# Patient Record
Sex: Male | Born: 1935 | Race: White | Hispanic: No | Marital: Married
Health system: Southern US, Community
[De-identification: ages and names within clinical notes are randomized; demographics above are authoritative.]

---

## 2009-09-17 ENCOUNTER — Ambulatory Visit: Payer: Self-pay | Admitting: Family Medicine

## 2009-09-21 ENCOUNTER — Ambulatory Visit: Payer: Self-pay | Admitting: Family Medicine

## 2012-06-03 ENCOUNTER — Ambulatory Visit: Payer: Self-pay | Admitting: Specialist

## 2012-06-07 ENCOUNTER — Ambulatory Visit: Payer: Self-pay | Admitting: Internal Medicine

## 2012-06-14 ENCOUNTER — Ambulatory Visit: Payer: Self-pay | Admitting: Internal Medicine

## 2012-06-14 ENCOUNTER — Inpatient Hospital Stay: Payer: Self-pay | Admitting: Internal Medicine

## 2012-06-14 LAB — CBC WITH DIFFERENTIAL/PLATELET
Basophil #: 0.1 10*3/uL (ref 0.0–0.1)
Basophil %: 0.7 %
Eosinophil #: 0 10*3/uL (ref 0.0–0.7)
Eosinophil %: 0.6 %
HCT: 36.5 % — ABNORMAL LOW (ref 40.0–52.0)
Lymphocyte #: 0.6 10*3/uL — ABNORMAL LOW (ref 1.0–3.6)
Lymphocyte %: 9 %
MCH: 28.4 pg (ref 26.0–34.0)
MCHC: 32 g/dL (ref 32.0–36.0)
MCV: 89 fL (ref 80–100)
Monocyte #: 0.5 x10 3/mm (ref 0.2–1.0)
Monocyte %: 6.7 %
Neutrophil #: 5.8 10*3/uL (ref 1.4–6.5)
Neutrophil %: 83 %
RBC: 4.12 10*6/uL — ABNORMAL LOW (ref 4.40–5.90)
RDW: 15.2 % — ABNORMAL HIGH (ref 11.5–14.5)

## 2012-06-14 LAB — COMPREHENSIVE METABOLIC PANEL
Albumin: 2.7 g/dL — ABNORMAL LOW (ref 3.4–5.0)
Anion Gap: 7 (ref 7–16)
Calcium, Total: 8.8 mg/dL (ref 8.5–10.1)
Chloride: 106 mmol/L (ref 98–107)
Creatinine: 1.04 mg/dL (ref 0.60–1.30)
EGFR (Non-African Amer.): >60
Osmolality: 288 (ref 275–301)
Potassium: 4.4 mmol/L (ref 3.5–5.1)
SGOT(AST): 34 U/L (ref 15–37)
Total Protein: 7 g/dL (ref 6.4–8.2)

## 2012-06-14 LAB — MAGNESIUM: Magnesium: 1.8 mg/dL

## 2012-06-15 LAB — CREATININE, SERUM
Creatinine: 0.94 mg/dL (ref 0.60–1.30)
EGFR (African American): >60
EGFR (Non-African Amer.): >60

## 2012-06-15 LAB — PROTIME-INR: Prothrombin Time: 14.8 secs — ABNORMAL HIGH (ref 11.5–14.7)

## 2012-06-16 LAB — BASIC METABOLIC PANEL
Anion Gap: 6 — ABNORMAL LOW (ref 7–16)
Calcium, Total: 8.9 mg/dL (ref 8.5–10.1)
Chloride: 104 mmol/L (ref 98–107)
Co2: 27 mmol/L (ref 21–32)
Creatinine: 0.91 mg/dL (ref 0.60–1.30)
EGFR (African American): >60
EGFR (Non-African Amer.): >60
Glucose: 183 mg/dL — ABNORMAL HIGH (ref 65–99)
Potassium: 5.1 mmol/L (ref 3.5–5.1)

## 2012-06-17 LAB — CBC WITH DIFFERENTIAL/PLATELET
Basophil #: 0 10*3/uL (ref 0.0–0.1)
Basophil %: 0.8 %
Eosinophil #: 0 10*3/uL (ref 0.0–0.7)
Eosinophil %: 0 %
HCT: 38.3 % — ABNORMAL LOW (ref 40.0–52.0)
HGB: 11.9 g/dL — ABNORMAL LOW (ref 13.0–18.0)
HGB: 11.9 g/dL — ABNORMAL LOW (ref 13.0–18.0)
Lymphocyte #: 0.2 10*3/uL — ABNORMAL LOW (ref 1.0–3.6)
Lymphocyte %: 1 %
MCH: 28.1 pg (ref 26.0–34.0)
MCHC: 31.4 g/dL — ABNORMAL LOW (ref 32.0–36.0)
MCHC: 31 g/dL — ABNORMAL LOW (ref 32.0–36.0)
MCV: 90 fL (ref 80–100)
Monocyte #: 0.4 x10 3/mm (ref 0.2–1.0)
Monocyte %: 3.2 %
Neutrophil %: 94.4 %
Neutrophil %: 95 %
Platelet: 239 10*3/uL (ref 150–440)
RBC: 4.25 10*6/uL — ABNORMAL LOW (ref 4.40–5.90)
RBC: 4.27 10*6/uL — ABNORMAL LOW (ref 4.40–5.90)
RDW: 15.5 % — ABNORMAL HIGH (ref 11.5–14.5)
RDW: 15.3 % — ABNORMAL HIGH (ref 11.5–14.5)
WBC: 14.7 10*3/uL — ABNORMAL HIGH (ref 3.8–10.6)
WBC: 11.4 10*3/uL — ABNORMAL HIGH (ref 3.8–10.6)

## 2012-06-17 LAB — BASIC METABOLIC PANEL
Anion Gap: 4 — ABNORMAL LOW (ref 7–16)
BUN: 45 mg/dL — ABNORMAL HIGH (ref 7–18)
BUN: 45 mg/dL — ABNORMAL HIGH (ref 7–18)
Calcium, Total: 8.8 mg/dL (ref 8.5–10.1)
Calcium, Total: 8.9 mg/dL (ref 8.5–10.1)
Chloride: 101 mmol/L (ref 98–107)
Co2: 34 mmol/L — ABNORMAL HIGH (ref 21–32)
Creatinine: 0.99 mg/dL (ref 0.60–1.30)
Creatinine: 1.03 mg/dL (ref 0.60–1.30)
EGFR (Non-African Amer.): >60
Glucose: 135 mg/dL — ABNORMAL HIGH (ref 65–99)
Glucose: 129 mg/dL — ABNORMAL HIGH (ref 65–99)
Osmolality: 291 (ref 275–301)
Osmolality: 289 (ref 275–301)
Potassium: 5.9 mmol/L — ABNORMAL HIGH (ref 3.5–5.1)
Sodium: 139 mmol/L (ref 136–145)

## 2012-06-17 LAB — URIC ACID: Uric Acid: 6.9 mg/dL (ref 3.5–7.2)

## 2012-06-17 LAB — COMPREHENSIVE METABOLIC PANEL
Albumin: 2.7 g/dL — ABNORMAL LOW (ref 3.4–5.0)
Alkaline Phosphatase: 140 U/L — ABNORMAL HIGH (ref 50–136)
Anion Gap: 6 — ABNORMAL LOW (ref 7–16)
Calcium, Total: 9 mg/dL (ref 8.5–10.1)
Co2: 29 mmol/L (ref 21–32)
Creatinine: 0.93 mg/dL (ref 0.60–1.30)
EGFR (Non-African Amer.): >60
Osmolality: 282 (ref 275–301)
Potassium: 6.3 mmol/L — ABNORMAL HIGH (ref 3.5–5.1)
SGOT(AST): 38 U/L — ABNORMAL HIGH (ref 15–37)
SGPT (ALT): 36 U/L (ref 12–78)
Total Protein: 6.9 g/dL (ref 6.4–8.2)

## 2012-06-17 LAB — PHOSPHORUS: Phosphorus: 2.6 mg/dL (ref 2.5–4.9)

## 2012-06-17 LAB — POTASSIUM
Potassium: 5.8 mmol/L — ABNORMAL HIGH (ref 3.5–5.1)
Potassium: 5.9 mmol/L — ABNORMAL HIGH (ref 3.5–5.1)

## 2012-06-17 LAB — CREATININE, SERUM
Creatinine: 0.99 mg/dL (ref 0.60–1.30)
EGFR (African American): >60
EGFR (Non-African Amer.): >60

## 2012-06-18 LAB — COMPREHENSIVE METABOLIC PANEL
Albumin: 2.6 g/dL — ABNORMAL LOW (ref 3.4–5.0)
Bilirubin,Total: 0.2 mg/dL (ref 0.2–1.0)
Calcium, Total: 8.4 mg/dL — ABNORMAL LOW (ref 8.5–10.1)
Co2: 32 mmol/L (ref 21–32)
Osmolality: 296 (ref 275–301)
Potassium: 4.8 mmol/L (ref 3.5–5.1)
SGOT(AST): 52 U/L — ABNORMAL HIGH (ref 15–37)
Total Protein: 6.4 g/dL (ref 6.4–8.2)

## 2012-06-18 LAB — BASIC METABOLIC PANEL
Anion Gap: 8 (ref 7–16)
BUN: 44 mg/dL — ABNORMAL HIGH (ref 7–18)
Calcium, Total: 8.3 mg/dL — ABNORMAL LOW (ref 8.5–10.1)
Co2: 29 mmol/L (ref 21–32)
EGFR (African American): >60
EGFR (Non-African Amer.): >60
Glucose: 117 mg/dL — ABNORMAL HIGH (ref 65–99)
Osmolality: 288 (ref 275–301)
Potassium: 5.2 mmol/L — ABNORMAL HIGH (ref 3.5–5.1)
Sodium: 138 mmol/L (ref 136–145)

## 2012-06-18 LAB — PHOSPHORUS: Phosphorus: 3.6 mg/dL (ref 2.5–4.9)

## 2012-06-18 LAB — CREATININE, SERUM
Creatinine: 1 mg/dL (ref 0.60–1.30)
EGFR (Non-African Amer.): >60

## 2012-06-18 LAB — POTASSIUM: Potassium: 5.7 mmol/L — ABNORMAL HIGH (ref 3.5–5.1)

## 2012-06-19 LAB — POTASSIUM: Potassium: 4.8 mmol/L (ref 3.5–5.1)

## 2012-06-19 LAB — BASIC METABOLIC PANEL
Anion Gap: 8 (ref 7–16)
Creatinine: 0.92 mg/dL (ref 0.60–1.30)
EGFR (African American): >60
EGFR (Non-African Amer.): >60
Sodium: 143 mmol/L (ref 136–145)

## 2012-06-19 LAB — PROTIME-INR: INR: 1

## 2012-06-19 LAB — PLATELET COUNT: Platelet: 196 10*3/uL (ref 150–440)

## 2012-06-19 LAB — SGOT (AST)(ARMC): SGOT(AST): 43 U/L — ABNORMAL HIGH (ref 15–37)

## 2012-06-20 LAB — BASIC METABOLIC PANEL
BUN: 36 mg/dL — ABNORMAL HIGH (ref 7–18)
Calcium, Total: 8.5 mg/dL (ref 8.5–10.1)
Creatinine: 0.65 mg/dL (ref 0.60–1.30)
EGFR (African American): >60
Glucose: 105 mg/dL — ABNORMAL HIGH (ref 65–99)
Sodium: 143 mmol/L (ref 136–145)

## 2012-06-20 LAB — CBC WITH DIFFERENTIAL/PLATELET
Eosinophil %: 0 %
HCT: 38 % — ABNORMAL LOW (ref 40.0–52.0)
HGB: 12.1 g/dL — ABNORMAL LOW (ref 13.0–18.0)
Lymphocyte %: 0.5 %
MCHC: 31.7 g/dL — ABNORMAL LOW (ref 32.0–36.0)
MCV: 90 fL (ref 80–100)
Monocyte #: 0.1 x10 3/mm — ABNORMAL LOW (ref 0.2–1.0)
Monocyte %: 0.3 %
Neutrophil #: 39.5 10*3/uL — ABNORMAL HIGH (ref 1.4–6.5)
Neutrophil %: 98.6 %
RDW: 15.7 % — ABNORMAL HIGH (ref 11.5–14.5)
WBC: 40.1 10*3/uL — ABNORMAL HIGH (ref 3.8–10.6)

## 2012-06-21 LAB — BASIC METABOLIC PANEL
BUN: 34 mg/dL — ABNORMAL HIGH (ref 7–18)
Calcium, Total: 8.6 mg/dL (ref 8.5–10.1)
Chloride: 107 mmol/L (ref 98–107)
Co2: 32 mmol/L (ref 21–32)
Creatinine: 0.87 mg/dL (ref 0.60–1.30)
Glucose: 109 mg/dL — ABNORMAL HIGH (ref 65–99)
Osmolality: 295 (ref 275–301)
Potassium: 4.9 mmol/L (ref 3.5–5.1)
Sodium: 144 mmol/L (ref 136–145)

## 2012-06-22 ENCOUNTER — Ambulatory Visit: Payer: Self-pay | Admitting: Internal Medicine

## 2012-06-22 LAB — CBC WITH DIFFERENTIAL/PLATELET
Basophil #: 0 10*3/uL (ref 0.0–0.1)
Basophil %: 0.1 %
Eosinophil #: 0 10*3/uL (ref 0.0–0.7)
Eosinophil %: 0 %
HCT: 35.6 % — ABNORMAL LOW (ref 40.0–52.0)
HGB: 10.9 g/dL — ABNORMAL LOW (ref 13.0–18.0)
Lymphocyte %: 1 %
MCHC: 30.7 g/dL — ABNORMAL LOW (ref 32.0–36.0)
Monocyte %: 1.2 %
Neutrophil #: 31.1 10*3/uL — ABNORMAL HIGH (ref 1.4–6.5)
Neutrophil %: 97.7 %
Platelet: 93 10*3/uL — ABNORMAL LOW (ref 150–440)
WBC: 31.9 10*3/uL — ABNORMAL HIGH (ref 3.8–10.6)

## 2012-06-22 LAB — BASIC METABOLIC PANEL
Anion Gap: 6 — ABNORMAL LOW (ref 7–16)
Co2: 34 mmol/L — ABNORMAL HIGH (ref 21–32)

## 2012-06-23 LAB — CBC WITH DIFFERENTIAL/PLATELET
Basophil #: 0 10*3/uL (ref 0.0–0.1)
Basophil %: 0.1 %
Eosinophil #: 0 10*3/uL (ref 0.0–0.7)
Lymphocyte %: 1.7 %
MCHC: 30.9 g/dL — ABNORMAL LOW (ref 32.0–36.0)
MCV: 90 fL (ref 80–100)
Monocyte #: 0.7 x10 3/mm (ref 0.2–1.0)
Monocyte %: 3.6 %
Neutrophil %: 94.6 %
Platelet: 54 10*3/uL — ABNORMAL LOW (ref 150–440)
RBC: 3.87 10*6/uL — ABNORMAL LOW (ref 4.40–5.90)
RDW: 15.6 % — ABNORMAL HIGH (ref 11.5–14.5)
WBC: 19.6 10*3/uL — ABNORMAL HIGH (ref 3.8–10.6)

## 2012-06-23 LAB — BASIC METABOLIC PANEL
Anion Gap: 4 — ABNORMAL LOW (ref 7–16)
BUN: 25 mg/dL — ABNORMAL HIGH (ref 7–18)
Co2: 33 mmol/L — ABNORMAL HIGH (ref 21–32)
Creatinine: 0.82 mg/dL (ref 0.60–1.30)
Osmolality: 284 (ref 275–301)
Potassium: 5.9 mmol/L — ABNORMAL HIGH (ref 3.5–5.1)
Sodium: 140 mmol/L (ref 136–145)

## 2012-06-23 LAB — POTASSIUM: Potassium: 5.4 mmol/L — ABNORMAL HIGH (ref 3.5–5.1)

## 2012-06-24 LAB — BASIC METABOLIC PANEL
Anion Gap: 4 — ABNORMAL LOW (ref 7–16)
Calcium, Total: 8.2 mg/dL — ABNORMAL LOW (ref 8.5–10.1)
Co2: 35 mmol/L — ABNORMAL HIGH (ref 21–32)
EGFR (African American): >60
EGFR (Non-African Amer.): >60
Potassium: 4.3 mmol/L (ref 3.5–5.1)

## 2012-06-24 LAB — CBC WITH DIFFERENTIAL/PLATELET
Bands: 17 %
MCV: 90 fL (ref 80–100)
Monocytes: 3 %
Myelocyte: 1 %
Platelet: 30 10*3/uL — CL (ref 150–440)
RDW: 15.2 % — ABNORMAL HIGH (ref 11.5–14.5)

## 2012-06-25 LAB — CBC WITH DIFFERENTIAL/PLATELET
Basophil #: 0 10*3/uL (ref 0.0–0.1)
Eosinophil #: 0.1 10*3/uL (ref 0.0–0.7)
HCT: 33.3 % — ABNORMAL LOW (ref 40.0–52.0)
Lymphocyte #: 0.7 10*3/uL — ABNORMAL LOW (ref 1.0–3.6)
Lymphocyte %: 10.6 %
MCH: 28 pg (ref 26.0–34.0)
Monocyte %: 7.7 %
Neutrophil #: 5 10*3/uL (ref 1.4–6.5)
Neutrophil %: 80.7 %
Platelet: 21 10*3/uL — CL (ref 150–440)
RBC: 3.76 10*6/uL — ABNORMAL LOW (ref 4.40–5.90)
RDW: 15.5 % — ABNORMAL HIGH (ref 11.5–14.5)
WBC: 6.2 10*3/uL (ref 3.8–10.6)

## 2012-06-25 LAB — PLATELET COUNT: Platelet: 20 10*3/uL — CL (ref 150–440)

## 2012-06-25 LAB — BASIC METABOLIC PANEL
BUN: 20 mg/dL — ABNORMAL HIGH (ref 7–18)
Calcium, Total: 8.5 mg/dL (ref 8.5–10.1)
Co2: 35 mmol/L — ABNORMAL HIGH (ref 21–32)
Creatinine: 0.8 mg/dL (ref 0.60–1.30)
EGFR (African American): >60
EGFR (Non-African Amer.): >60
Glucose: 77 mg/dL (ref 65–99)
Sodium: 140 mmol/L (ref 136–145)

## 2012-06-25 LAB — POTASSIUM: Potassium: 4.6 mmol/L (ref 3.5–5.1)

## 2012-06-26 LAB — CBC WITH DIFFERENTIAL/PLATELET
Basophil #: 0 10*3/uL (ref 0.0–0.1)
Eosinophil #: 0 10*3/uL (ref 0.0–0.7)
Eosinophil %: 0.5 %
HGB: 10.6 g/dL — ABNORMAL LOW (ref 13.0–18.0)
Lymphocyte #: 0.9 10*3/uL — ABNORMAL LOW (ref 1.0–3.6)
Lymphocyte %: 9.5 %
Monocyte #: 0.4 x10 3/mm (ref 0.2–1.0)
Monocyte %: 4.4 %
Neutrophil #: 8.1 10*3/uL — ABNORMAL HIGH (ref 1.4–6.5)
Platelet: 23 10*3/uL — CL (ref 150–440)
RBC: 3.79 10*6/uL — ABNORMAL LOW (ref 4.40–5.90)
RDW: 15.7 % — ABNORMAL HIGH (ref 11.5–14.5)
WBC: 9.5 10*3/uL (ref 3.8–10.6)

## 2012-06-26 LAB — CREATININE, SERUM: EGFR (Non-African Amer.): >60

## 2012-06-27 LAB — COMPREHENSIVE METABOLIC PANEL
Alkaline Phosphatase: 150 U/L — ABNORMAL HIGH (ref 50–136)
Anion Gap: 2 — ABNORMAL LOW (ref 7–16)
BUN: 20 mg/dL — ABNORMAL HIGH (ref 7–18)
Calcium, Total: 9.4 mg/dL (ref 8.5–10.1)
Chloride: 98 mmol/L (ref 98–107)
Co2: 39 mmol/L — ABNORMAL HIGH (ref 21–32)
Creatinine: 0.84 mg/dL (ref 0.60–1.30)
EGFR (Non-African Amer.): >60
Glucose: 82 mg/dL (ref 65–99)
Osmolality: 279 (ref 275–301)
SGPT (ALT): 48 U/L (ref 12–78)
Total Protein: 5.8 g/dL — ABNORMAL LOW (ref 6.4–8.2)

## 2012-06-27 LAB — CBC WITH DIFFERENTIAL/PLATELET
Basophil %: 0.3 %
Eosinophil #: 0 10*3/uL (ref 0.0–0.7)
Eosinophil %: 0.3 %
HCT: 34 % — ABNORMAL LOW (ref 40.0–52.0)
HGB: 10.8 g/dL — ABNORMAL LOW (ref 13.0–18.0)
Lymphocyte %: 7 %
MCHC: 31.8 g/dL — ABNORMAL LOW (ref 32.0–36.0)
Monocyte #: 0.6 x10 3/mm (ref 0.2–1.0)
Monocyte %: 4.2 %
Neutrophil #: 11.8 10*3/uL — ABNORMAL HIGH (ref 1.4–6.5)
Platelet: 31 10*3/uL — ABNORMAL LOW (ref 150–440)
RDW: 15.7 % — ABNORMAL HIGH (ref 11.5–14.5)

## 2012-06-28 LAB — COMPREHENSIVE METABOLIC PANEL
Albumin: 2.7 g/dL — ABNORMAL LOW (ref 3.4–5.0)
Alkaline Phosphatase: 171 U/L — ABNORMAL HIGH (ref 50–136)
Anion Gap: 3 — ABNORMAL LOW (ref 7–16)
Bilirubin,Total: 0.4 mg/dL (ref 0.2–1.0)
Chloride: 100 mmol/L (ref 98–107)
Co2: 36 mmol/L — ABNORMAL HIGH (ref 21–32)
Creatinine: 0.8 mg/dL (ref 0.60–1.30)
EGFR (African American): >60
Glucose: 88 mg/dL (ref 65–99)
Osmolality: 280 (ref 275–301)
SGOT(AST): 21 U/L (ref 15–37)
SGPT (ALT): 46 U/L (ref 12–78)
Sodium: 139 mmol/L (ref 136–145)
Total Protein: 5.9 g/dL — ABNORMAL LOW (ref 6.4–8.2)

## 2012-06-28 LAB — CBC WITH DIFFERENTIAL/PLATELET
Bands: 11 %
Basophil %: 0.3 %
Eosinophil #: 0 10*3/uL (ref 0.0–0.7)
HCT: 33.7 % — ABNORMAL LOW (ref 40.0–52.0)
HGB: 10.7 g/dL — ABNORMAL LOW (ref 13.0–18.0)
Lymphocyte #: 1.3 10*3/uL (ref 1.0–3.6)
Lymphocyte %: 8.2 %
Lymphocytes: 6 %
MCH: 27.8 pg (ref 26.0–34.0)
MCV: 88 fL (ref 80–100)
Monocyte #: 0.8 x10 3/mm (ref 0.2–1.0)
Neutrophil %: 86.6 %
Platelet: 52 10*3/uL — ABNORMAL LOW (ref 150–440)
RBC: 3.84 10*6/uL — ABNORMAL LOW (ref 4.40–5.90)
RDW: 15.3 % — ABNORMAL HIGH (ref 11.5–14.5)
WBC: 16.5 10*3/uL — ABNORMAL HIGH (ref 3.8–10.6)

## 2012-06-28 LAB — POTASSIUM: Potassium: 4.8 mmol/L (ref 3.5–5.1)

## 2012-06-29 ENCOUNTER — Other Ambulatory Visit: Payer: Self-pay | Admitting: Internal Medicine

## 2012-06-30 ENCOUNTER — Other Ambulatory Visit: Payer: Self-pay | Admitting: Internal Medicine

## 2012-07-10 ENCOUNTER — Ambulatory Visit: Payer: Self-pay | Admitting: Internal Medicine

## 2012-07-10 LAB — CBC CANCER CENTER
Basophil %: 1.4 %
Eosinophil #: 0 x10 3/mm (ref 0.0–0.7)
HCT: 31.9 % — ABNORMAL LOW (ref 40.0–52.0)
HGB: 10.2 g/dL — ABNORMAL LOW (ref 13.0–18.0)
Lymphocyte %: 11 %
MCH: 28.1 pg (ref 26.0–34.0)
MCHC: 32 g/dL (ref 32.0–36.0)
Monocyte #: 1 x10 3/mm (ref 0.2–1.0)
Neutrophil #: 8.4 x10 3/mm — ABNORMAL HIGH (ref 1.4–6.5)
Neutrophil %: 77.6 %
Platelet: 356 x10 3/mm (ref 150–440)
RBC: 3.62 10*6/uL — ABNORMAL LOW (ref 4.40–5.90)
RDW: 17.4 % — ABNORMAL HIGH (ref 11.5–14.5)
WBC: 10.8 x10 3/mm — ABNORMAL HIGH (ref 3.8–10.6)

## 2012-07-10 LAB — COMPREHENSIVE METABOLIC PANEL
Alkaline Phosphatase: 137 U/L — ABNORMAL HIGH (ref 50–136)
BUN: 16 mg/dL (ref 7–18)
Calcium, Total: 9.4 mg/dL (ref 8.5–10.1)
Chloride: 102 mmol/L (ref 98–107)
Co2: 35 mmol/L — ABNORMAL HIGH (ref 21–32)
Creatinine: 0.94 mg/dL (ref 0.60–1.30)
EGFR (African American): >60
Glucose: 93 mg/dL (ref 65–99)
Osmolality: 282 (ref 275–301)
SGOT(AST): 21 U/L (ref 15–37)
SGPT (ALT): 28 U/L (ref 12–78)
Sodium: 141 mmol/L (ref 136–145)
Total Protein: 6.7 g/dL (ref 6.4–8.2)

## 2012-07-15 LAB — CBC CANCER CENTER
Basophil #: 0.1 x10 3/mm (ref 0.0–0.1)
Basophil %: 1.2 %
Eosinophil #: 0 x10 3/mm (ref 0.0–0.7)
HCT: 31.7 % — ABNORMAL LOW (ref 40.0–52.0)
HGB: 10.3 g/dL — ABNORMAL LOW (ref 13.0–18.0)
Lymphocyte #: 1.4 x10 3/mm (ref 1.0–3.6)
MCHC: 32.5 g/dL (ref 32.0–36.0)
MCV: 89 fL (ref 80–100)
Monocyte %: 10.6 %
Neutrophil #: 7.3 x10 3/mm — ABNORMAL HIGH (ref 1.4–6.5)
Platelet: 376 x10 3/mm (ref 150–440)
RDW: 18.3 % — ABNORMAL HIGH (ref 11.5–14.5)

## 2012-07-15 LAB — CREATININE, SERUM: Creatinine: 1 mg/dL (ref 0.60–1.30)

## 2012-07-16 LAB — CREATININE, SERUM
Creatinine: 1.04 mg/dL (ref 0.60–1.30)
EGFR (Non-African Amer.): >60

## 2012-07-17 LAB — BASIC METABOLIC PANEL
Anion Gap: 9 (ref 7–16)
BUN: 28 mg/dL — ABNORMAL HIGH (ref 7–18)
Co2: 29 mmol/L (ref 21–32)
Creatinine: 1.18 mg/dL (ref 0.60–1.30)
Glucose: 135 mg/dL — ABNORMAL HIGH (ref 65–99)
Osmolality: 289 (ref 275–301)
Potassium: 5.2 mmol/L — ABNORMAL HIGH (ref 3.5–5.1)
Sodium: 141 mmol/L (ref 136–145)

## 2012-07-18 LAB — CREATININE, SERUM
Creatinine: 1.1 mg/dL (ref 0.60–1.30)
EGFR (African American): >60

## 2012-07-19 LAB — POTASSIUM: Potassium: 3.9 mmol/L (ref 3.5–5.1)

## 2012-07-20 ENCOUNTER — Ambulatory Visit: Payer: Self-pay | Admitting: Internal Medicine

## 2012-07-22 LAB — CBC CANCER CENTER
Basophil #: 0 x10 3/mm (ref 0.0–0.1)
Eosinophil #: 0.1 x10 3/mm (ref 0.0–0.7)
Eosinophil %: 0.6 %
HCT: 30.7 % — ABNORMAL LOW (ref 40.0–52.0)
HGB: 9.6 g/dL — ABNORMAL LOW (ref 13.0–18.0)
Lymphocyte #: 1 x10 3/mm (ref 1.0–3.6)
Lymphocyte %: 5.4 %
MCH: 27.8 pg (ref 26.0–34.0)
MCV: 89 fL (ref 80–100)
Platelet: 223 x10 3/mm (ref 150–440)
RDW: 18.6 % — ABNORMAL HIGH (ref 11.5–14.5)
WBC: 18.9 x10 3/mm — ABNORMAL HIGH (ref 3.8–10.6)

## 2012-07-25 LAB — POTASSIUM: Potassium: 4.3 mmol/L (ref 3.5–5.1)

## 2012-07-25 LAB — CBC CANCER CENTER
Basophil #: 0.2 x10 3/mm — ABNORMAL HIGH (ref 0.0–0.1)
Basophil %: 2.8 %
Eosinophil #: 0.1 x10 3/mm (ref 0.0–0.7)
HGB: 9.8 g/dL — ABNORMAL LOW (ref 13.0–18.0)
Lymphocyte #: 0.9 x10 3/mm — ABNORMAL LOW (ref 1.0–3.6)
MCHC: 32.2 g/dL (ref 32.0–36.0)
MCV: 90 fL (ref 80–100)
Monocyte #: 0.3 x10 3/mm (ref 0.2–1.0)
Monocyte %: 4.8 %
Neutrophil #: 3.9 x10 3/mm (ref 1.4–6.5)
Neutrophil %: 73.9 %
Platelet: 154 x10 3/mm (ref 150–440)
RBC: 3.39 10*6/uL — ABNORMAL LOW (ref 4.40–5.90)
RDW: 18.3 % — ABNORMAL HIGH (ref 11.5–14.5)
WBC: 5.3 x10 3/mm (ref 3.8–10.6)

## 2012-07-29 LAB — CBC CANCER CENTER
Basophil #: 0.1 x10 3/mm (ref 0.0–0.1)
Basophil %: 0.8 %
Eosinophil %: 0.6 %
HCT: 31.3 % — ABNORMAL LOW (ref 40.0–52.0)
HGB: 10 g/dL — ABNORMAL LOW (ref 13.0–18.0)
Lymphocyte %: 18.9 %
MCV: 91 fL (ref 80–100)
Platelet: 106 x10 3/mm — ABNORMAL LOW (ref 150–440)
RBC: 3.46 10*6/uL — ABNORMAL LOW (ref 4.40–5.90)
RDW: 19.6 % — ABNORMAL HIGH (ref 11.5–14.5)

## 2012-07-29 LAB — POTASSIUM: Potassium: 4.5 mmol/L (ref 3.5–5.1)

## 2012-08-05 LAB — CBC CANCER CENTER
Basophil #: 0.1 x10 3/mm (ref 0.0–0.1)
Basophil %: 0.3 %
Eosinophil #: 0 x10 3/mm (ref 0.0–0.7)
Eosinophil %: 0.2 %
HCT: 31.1 % — ABNORMAL LOW (ref 40.0–52.0)
HGB: 9.7 g/dL — ABNORMAL LOW (ref 13.0–18.0)
Lymphocyte #: 1.5 x10 3/mm (ref 1.0–3.6)
MCHC: 31.1 g/dL — ABNORMAL LOW (ref 32.0–36.0)
Monocyte #: 1.4 x10 3/mm — ABNORMAL HIGH (ref 0.2–1.0)
Monocyte %: 6.8 %
Neutrophil #: 17.7 x10 3/mm — ABNORMAL HIGH (ref 1.4–6.5)
Neutrophil %: 85.7 %
Platelet: 403 x10 3/mm (ref 150–440)
RBC: 3.42 10*6/uL — ABNORMAL LOW (ref 4.40–5.90)
RDW: 20.6 % — ABNORMAL HIGH (ref 11.5–14.5)

## 2012-08-05 LAB — COMPREHENSIVE METABOLIC PANEL
Alkaline Phosphatase: 144 U/L — ABNORMAL HIGH (ref 50–136)
Anion Gap: 11 (ref 7–16)
BUN: 29 mg/dL — ABNORMAL HIGH (ref 7–18)
Bilirubin,Total: 0.3 mg/dL (ref 0.2–1.0)
Chloride: 103 mmol/L (ref 98–107)
Co2: 29 mmol/L (ref 21–32)
Creatinine: 1.32 mg/dL — ABNORMAL HIGH (ref 0.60–1.30)
EGFR (African American): 59 — ABNORMAL LOW
EGFR (Non-African Amer.): 51 — ABNORMAL LOW
Total Protein: 7.5 g/dL (ref 6.4–8.2)

## 2012-08-12 LAB — CBC CANCER CENTER
Basophil #: 0.1 x10 3/mm (ref 0.0–0.1)
Eosinophil #: 0.1 x10 3/mm (ref 0.0–0.7)
HCT: 30.4 % — ABNORMAL LOW (ref 40.0–52.0)
HGB: 9.6 g/dL — ABNORMAL LOW (ref 13.0–18.0)
Lymphocyte #: 1.8 x10 3/mm (ref 1.0–3.6)
Lymphocyte %: 9.4 %
MCH: 28.6 pg (ref 26.0–34.0)
MCHC: 31.7 g/dL — ABNORMAL LOW (ref 32.0–36.0)
Monocyte #: 1.5 x10 3/mm — ABNORMAL HIGH (ref 0.2–1.0)
Neutrophil %: 81.8 %
RBC: 3.37 10*6/uL — ABNORMAL LOW (ref 4.40–5.90)
WBC: 19 x10 3/mm — ABNORMAL HIGH (ref 3.8–10.6)

## 2012-08-12 LAB — CREATININE, SERUM
Creatinine: 1.23 mg/dL (ref 0.60–1.30)
EGFR (African American): >60
EGFR (Non-African Amer.): 55 — ABNORMAL LOW

## 2012-08-14 LAB — BASIC METABOLIC PANEL
Anion Gap: 9 (ref 7–16)
BUN: 39 mg/dL — ABNORMAL HIGH (ref 7–18)
Chloride: 101 mmol/L (ref 98–107)
Creatinine: 1.56 mg/dL — ABNORMAL HIGH (ref 0.60–1.30)
EGFR (African American): 48 — ABNORMAL LOW
EGFR (Non-African Amer.): 42 — ABNORMAL LOW
Glucose: 165 mg/dL — ABNORMAL HIGH (ref 65–99)
Osmolality: 294 (ref 275–301)
Potassium: 4.8 mmol/L (ref 3.5–5.1)
Sodium: 141 mmol/L (ref 136–145)

## 2012-08-15 LAB — POTASSIUM: Potassium: 5.2 mmol/L — ABNORMAL HIGH (ref 3.5–5.1)

## 2012-08-16 LAB — BASIC METABOLIC PANEL
Anion Gap: 5 — ABNORMAL LOW (ref 7–16)
BUN: 41 mg/dL — ABNORMAL HIGH (ref 7–18)
Creatinine: 1.25 mg/dL (ref 0.60–1.30)
EGFR (Non-African Amer.): 54 — ABNORMAL LOW
Potassium: 4.1 mmol/L (ref 3.5–5.1)
Sodium: 139 mmol/L (ref 136–145)

## 2012-08-20 ENCOUNTER — Ambulatory Visit: Payer: Self-pay | Admitting: Internal Medicine

## 2012-09-02 LAB — COMPREHENSIVE METABOLIC PANEL
Alkaline Phosphatase: 146 U/L — ABNORMAL HIGH (ref 50–136)
Anion Gap: 13 (ref 7–16)
Bilirubin,Total: 0.2 mg/dL (ref 0.2–1.0)
Calcium, Total: 9.6 mg/dL (ref 8.5–10.1)
Chloride: 102 mmol/L (ref 98–107)
Creatinine: 1.49 mg/dL — ABNORMAL HIGH (ref 0.60–1.30)
EGFR (Non-African Amer.): 44 — ABNORMAL LOW
Glucose: 190 mg/dL — ABNORMAL HIGH (ref 65–99)
Osmolality: 300 (ref 275–301)
SGPT (ALT): 20 U/L (ref 12–78)

## 2012-09-02 LAB — CBC CANCER CENTER
Basophil #: 0.1 x10 3/mm (ref 0.0–0.1)
Eosinophil #: 0 x10 3/mm (ref 0.0–0.7)
Eosinophil %: 0.3 %
HCT: 32.3 % — ABNORMAL LOW (ref 40.0–52.0)
HGB: 9.8 g/dL — ABNORMAL LOW (ref 13.0–18.0)
Lymphocyte #: 1.5 x10 3/mm (ref 1.0–3.6)
MCH: 28.5 pg (ref 26.0–34.0)
MCHC: 30.4 g/dL — ABNORMAL LOW (ref 32.0–36.0)
MCV: 94 fL (ref 80–100)
Monocyte #: 0.7 x10 3/mm (ref 0.2–1.0)
Neutrophil #: 12.6 x10 3/mm — ABNORMAL HIGH (ref 1.4–6.5)
Platelet: 295 x10 3/mm (ref 150–440)
RBC: 3.45 10*6/uL — ABNORMAL LOW (ref 4.40–5.90)
WBC: 14.9 x10 3/mm — ABNORMAL HIGH (ref 3.8–10.6)

## 2012-09-02 LAB — URIC ACID: Uric Acid: 6 mg/dL (ref 3.5–7.2)

## 2012-09-09 LAB — CBC CANCER CENTER
Basophil %: 1.2 %
Eosinophil #: 0.1 x10 3/mm (ref 0.0–0.7)
HCT: 32.1 % — ABNORMAL LOW (ref 40.0–52.0)
HGB: 10.2 g/dL — ABNORMAL LOW (ref 13.0–18.0)
Lymphocyte #: 0.9 x10 3/mm — ABNORMAL LOW (ref 1.0–3.6)
Lymphocyte %: 6.8 %
MCH: 29.9 pg (ref 26.0–34.0)
Monocyte #: 0.6 x10 3/mm (ref 0.2–1.0)
Neutrophil #: 11.3 x10 3/mm — ABNORMAL HIGH (ref 1.4–6.5)
Neutrophil %: 86.8 %
Platelet: 301 x10 3/mm (ref 150–440)

## 2012-09-09 LAB — COMPREHENSIVE METABOLIC PANEL
Alkaline Phosphatase: 122 U/L (ref 50–136)
Anion Gap: 4 — ABNORMAL LOW (ref 7–16)
Bilirubin,Total: 0.2 mg/dL (ref 0.2–1.0)
Calcium, Total: 9.2 mg/dL (ref 8.5–10.1)
Creatinine: 1 mg/dL (ref 0.60–1.30)
EGFR (African American): >60
Glucose: 131 mg/dL — ABNORMAL HIGH (ref 65–99)
Osmolality: 285 (ref 275–301)
Potassium: 3.8 mmol/L (ref 3.5–5.1)
SGOT(AST): 23 U/L (ref 15–37)
SGPT (ALT): 18 U/L (ref 12–78)
Sodium: 140 mmol/L (ref 136–145)
Total Protein: 6.6 g/dL (ref 6.4–8.2)

## 2012-09-11 LAB — BASIC METABOLIC PANEL
Anion Gap: 12 (ref 7–16)
BUN: 40 mg/dL — ABNORMAL HIGH (ref 7–18)
Calcium, Total: 9.6 mg/dL (ref 8.5–10.1)
Chloride: 105 mmol/L (ref 98–107)
Co2: 26 mmol/L (ref 21–32)
Creatinine: 1.34 mg/dL — ABNORMAL HIGH (ref 0.60–1.30)
Potassium: 4.6 mmol/L (ref 3.5–5.1)
Sodium: 143 mmol/L (ref 136–145)

## 2012-09-12 LAB — CREATININE, SERUM: EGFR (African American): >60

## 2012-09-16 LAB — CBC CANCER CENTER
Basophil %: 0.1 %
Eosinophil %: 0.2 %
HGB: 9.2 g/dL — ABNORMAL LOW (ref 13.0–18.0)
Lymphocyte #: 1.4 x10 3/mm (ref 1.0–3.6)
MCV: 94 fL (ref 80–100)
Monocyte #: 0.6 x10 3/mm (ref 0.2–1.0)
Monocyte %: 2.1 %
Neutrophil #: 27.4 x10 3/mm — ABNORMAL HIGH (ref 1.4–6.5)
Neutrophil %: 92.9 %
Platelet: 156 x10 3/mm (ref 150–440)
RBC: 3.14 10*6/uL — ABNORMAL LOW (ref 4.40–5.90)

## 2012-09-16 LAB — CREATININE, SERUM
Creatinine: 0.91 mg/dL (ref 0.60–1.30)
EGFR (African American): >60
EGFR (Non-African Amer.): >60

## 2012-09-19 ENCOUNTER — Ambulatory Visit: Payer: Self-pay | Admitting: Internal Medicine

## 2012-09-23 LAB — CBC CANCER CENTER
Basophil %: 0.4 %
Eosinophil #: 0 x10 3/mm (ref 0.0–0.7)
Eosinophil %: 0 %
Lymphocyte #: 1.2 x10 3/mm (ref 1.0–3.6)
Lymphocyte %: 7.5 %
MCH: 29.3 pg (ref 26.0–34.0)
MCV: 95 fL (ref 80–100)
Monocyte #: 0.7 x10 3/mm (ref 0.2–1.0)
Monocyte %: 4.2 %
Neutrophil %: 87.9 %
Platelet: 124 x10 3/mm — ABNORMAL LOW (ref 150–440)
WBC: 15.8 x10 3/mm — ABNORMAL HIGH (ref 3.8–10.6)

## 2012-09-30 LAB — CBC CANCER CENTER
Basophil #: 0 x10 3/mm (ref 0.0–0.1)
Basophil %: 0.3 %
Eosinophil #: 0 x10 3/mm (ref 0.0–0.7)
Eosinophil %: 0 %
HCT: 32.7 % — ABNORMAL LOW (ref 40.0–52.0)
HGB: 10.4 g/dL — ABNORMAL LOW (ref 13.0–18.0)
Lymphocyte #: 1.2 x10 3/mm (ref 1.0–3.6)
Lymphocyte %: 10.3 %
MCH: 30.1 pg (ref 26.0–34.0)
MCHC: 31.9 g/dL — ABNORMAL LOW (ref 32.0–36.0)
MCV: 95 fL (ref 80–100)
Monocyte #: 0.5 x10 3/mm (ref 0.2–1.0)
Monocyte %: 4.6 %
Neutrophil #: 9.8 x10 3/mm — ABNORMAL HIGH (ref 1.4–6.5)
Neutrophil %: 84.8 %
Platelet: 222 x10 3/mm (ref 150–440)
RBC: 3.46 10*6/uL — ABNORMAL LOW (ref 4.40–5.90)
RDW: 18.9 % — ABNORMAL HIGH (ref 11.5–14.5)
WBC: 11.6 x10 3/mm — ABNORMAL HIGH (ref 3.8–10.6)

## 2012-09-30 LAB — POTASSIUM: Potassium: 4 mmol/L (ref 3.5–5.1)

## 2012-10-07 LAB — CBC CANCER CENTER
Basophil #: 0.1 x10 3/mm (ref 0.0–0.1)
Eosinophil #: 0 x10 3/mm (ref 0.0–0.7)
Eosinophil %: 0.4 %
Lymphocyte %: 14.3 %
MCHC: 31.2 g/dL — ABNORMAL LOW (ref 32.0–36.0)
Neutrophil %: 76.5 %
RBC: 3.67 10*6/uL — ABNORMAL LOW (ref 4.40–5.90)
RDW: 18 % — ABNORMAL HIGH (ref 11.5–14.5)
WBC: 10.9 x10 3/mm — ABNORMAL HIGH (ref 3.8–10.6)

## 2012-10-07 LAB — COMPREHENSIVE METABOLIC PANEL
Albumin: 3.3 g/dL — ABNORMAL LOW (ref 3.4–5.0)
Calcium, Total: 9.6 mg/dL (ref 8.5–10.1)
Chloride: 103 mmol/L (ref 98–107)
Co2: 32 mmol/L (ref 21–32)
Creatinine: 1.2 mg/dL (ref 0.60–1.30)
EGFR (African American): >60
Osmolality: 297 (ref 275–301)
SGOT(AST): 24 U/L (ref 15–37)

## 2012-10-07 LAB — MAGNESIUM: Magnesium: 1.3 mg/dL — ABNORMAL LOW

## 2012-10-09 LAB — CREATININE, SERUM
Creatinine: 1.23 mg/dL (ref 0.60–1.30)
EGFR (Non-African Amer.): 55 — ABNORMAL LOW

## 2012-10-09 LAB — POTASSIUM: Potassium: 4.2 mmol/L (ref 3.5–5.1)

## 2012-10-10 LAB — MAGNESIUM: Magnesium: 1.4 mg/dL — ABNORMAL LOW

## 2012-10-10 LAB — POTASSIUM: Potassium: 4.5 mmol/L (ref 3.5–5.1)

## 2012-10-15 LAB — CREATININE, SERUM: Creatinine: 1.2 mg/dL (ref 0.60–1.30)

## 2012-10-15 LAB — POTASSIUM: Potassium: 4.5 mmol/L (ref 3.5–5.1)

## 2012-10-17 LAB — CBC CANCER CENTER
Basophil #: 0.1 x10 3/mm (ref 0.0–0.1)
Basophil %: 0.6 %
Eosinophil #: 0 x10 3/mm (ref 0.0–0.7)
Eosinophil %: 0.2 %
HCT: 31.9 % — ABNORMAL LOW (ref 40.0–52.0)
HGB: 10.1 g/dL — ABNORMAL LOW (ref 13.0–18.0)
Lymphocyte #: 1 x10 3/mm (ref 1.0–3.6)
Lymphocyte %: 9 %
MCHC: 31.6 g/dL — ABNORMAL LOW (ref 32.0–36.0)
MCV: 96 fL (ref 80–100)
Monocyte %: 6.5 %
Neutrophil #: 9.1 x10 3/mm — ABNORMAL HIGH (ref 1.4–6.5)
Neutrophil %: 83.7 %
WBC: 10.9 x10 3/mm — ABNORMAL HIGH (ref 3.8–10.6)

## 2012-10-17 LAB — MAGNESIUM: Magnesium: 1.1 mg/dL — ABNORMAL LOW

## 2012-10-18 LAB — MAGNESIUM: Magnesium: 1.7 mg/dL — ABNORMAL LOW

## 2012-10-18 LAB — POTASSIUM: Potassium: 4.3 mmol/L (ref 3.5–5.1)

## 2012-10-20 ENCOUNTER — Ambulatory Visit: Payer: Self-pay | Admitting: Internal Medicine

## 2012-10-21 LAB — CBC CANCER CENTER
Basophil %: 0.3 %
Eosinophil #: 0 x10 3/mm (ref 0.0–0.7)
HGB: 10.2 g/dL — ABNORMAL LOW (ref 13.0–18.0)
Lymphocyte #: 2.1 x10 3/mm (ref 1.0–3.6)
Lymphocyte %: 12.2 %
MCH: 29.8 pg (ref 26.0–34.0)
Monocyte #: 0.9 x10 3/mm (ref 0.2–1.0)
Monocyte %: 5.4 %
Neutrophil %: 82 %
WBC: 16.8 x10 3/mm — ABNORMAL HIGH (ref 3.8–10.6)

## 2012-10-21 LAB — POTASSIUM: Potassium: 4.6 mmol/L (ref 3.5–5.1)

## 2012-10-23 LAB — MAGNESIUM: Magnesium: 1.2 mg/dL — ABNORMAL LOW

## 2012-10-25 LAB — MAGNESIUM: Magnesium: 1.4 mg/dL — ABNORMAL LOW

## 2012-10-28 LAB — BASIC METABOLIC PANEL
Anion Gap: 7 (ref 7–16)
Calcium, Total: 9.3 mg/dL (ref 8.5–10.1)
Chloride: 103 mmol/L (ref 98–107)
Co2: 30 mmol/L (ref 21–32)
EGFR (African American): 59 — ABNORMAL LOW
EGFR (Non-African Amer.): 50 — ABNORMAL LOW
Osmolality: 291 (ref 275–301)
Potassium: 4.1 mmol/L (ref 3.5–5.1)
Sodium: 140 mmol/L (ref 136–145)

## 2012-10-28 LAB — CBC CANCER CENTER
Basophil #: 0.1 x10 3/mm (ref 0.0–0.1)
HCT: 32.4 % — ABNORMAL LOW (ref 40.0–52.0)
Lymphocyte %: 13.2 %
MCHC: 32.1 g/dL (ref 32.0–36.0)
MCV: 95 fL (ref 80–100)
Monocyte #: 0.8 x10 3/mm (ref 0.2–1.0)
Monocyte %: 6.2 %
Neutrophil #: 9.8 x10 3/mm — ABNORMAL HIGH (ref 1.4–6.5)
WBC: 12.3 x10 3/mm — ABNORMAL HIGH (ref 3.8–10.6)

## 2012-10-28 LAB — MAGNESIUM: Magnesium: 1.2 mg/dL — ABNORMAL LOW

## 2012-11-01 LAB — MAGNESIUM: Magnesium: 1.4 mg/dL — ABNORMAL LOW

## 2012-11-04 LAB — COMPREHENSIVE METABOLIC PANEL
Albumin: 3.6 g/dL (ref 3.4–5.0)
Alkaline Phosphatase: 127 U/L (ref 50–136)
BUN: 26 mg/dL — ABNORMAL HIGH (ref 7–18)
Bilirubin,Total: 0.3 mg/dL (ref 0.2–1.0)
Calcium, Total: 9.7 mg/dL (ref 8.5–10.1)
Co2: 33 mmol/L — ABNORMAL HIGH (ref 21–32)
Creatinine: 1.05 mg/dL (ref 0.60–1.30)
EGFR (African American): >60
Glucose: 100 mg/dL — ABNORMAL HIGH (ref 65–99)
Osmolality: 288 (ref 275–301)
SGOT(AST): 26 U/L (ref 15–37)
Sodium: 142 mmol/L (ref 136–145)
Total Protein: 7.1 g/dL (ref 6.4–8.2)

## 2012-11-04 LAB — CBC CANCER CENTER
Basophil #: 0.1 x10 3/mm (ref 0.0–0.1)
Basophil %: 1 %
HGB: 11 g/dL — ABNORMAL LOW (ref 13.0–18.0)
Lymphocyte #: 1.2 x10 3/mm (ref 1.0–3.6)
Lymphocyte %: 10 %
MCHC: 32.1 g/dL (ref 32.0–36.0)
MCV: 96 fL (ref 80–100)
Monocyte #: 0.8 x10 3/mm (ref 0.2–1.0)
Neutrophil #: 9.7 x10 3/mm — ABNORMAL HIGH (ref 1.4–6.5)
Neutrophil %: 82.1 %
Platelet: 291 x10 3/mm (ref 150–440)
RBC: 3.57 10*6/uL — ABNORMAL LOW (ref 4.40–5.90)
RDW: 18.2 % — ABNORMAL HIGH (ref 11.5–14.5)

## 2012-11-04 LAB — MAGNESIUM: Magnesium: 1.6 mg/dL — ABNORMAL LOW

## 2012-11-11 LAB — CBC CANCER CENTER
Basophil %: 0.7 %
Eosinophil %: 0.9 %
HCT: 33.7 % — ABNORMAL LOW (ref 40.0–52.0)
MCHC: 32.9 g/dL (ref 32.0–36.0)
MCV: 96 fL (ref 80–100)
Neutrophil #: 8.5 x10 3/mm — ABNORMAL HIGH (ref 1.4–6.5)
Platelet: 229 x10 3/mm (ref 150–440)
RBC: 3.51 10*6/uL — ABNORMAL LOW (ref 4.40–5.90)
RDW: 18 % — ABNORMAL HIGH (ref 11.5–14.5)

## 2012-11-11 LAB — COMPREHENSIVE METABOLIC PANEL
Alkaline Phosphatase: 135 U/L (ref 50–136)
Anion Gap: 7 (ref 7–16)
Calcium, Total: 9.5 mg/dL (ref 8.5–10.1)
Co2: 31 mmol/L (ref 21–32)
Creatinine: 1.23 mg/dL (ref 0.60–1.30)
EGFR (Non-African Amer.): 55 — ABNORMAL LOW
Glucose: 135 mg/dL — ABNORMAL HIGH (ref 65–99)
Osmolality: 293 (ref 275–301)
Potassium: 3.8 mmol/L (ref 3.5–5.1)
SGOT(AST): 23 U/L (ref 15–37)
SGPT (ALT): 19 U/L (ref 12–78)
Sodium: 143 mmol/L (ref 136–145)

## 2012-11-11 LAB — MAGNESIUM: Magnesium: 1.4 mg/dL — ABNORMAL LOW

## 2012-11-13 LAB — CREATININE, SERUM
Creatinine: 1.29 mg/dL (ref 0.60–1.30)
Creatinine: 1.05 mg/dL (ref 0.60–1.30)
EGFR (African American): >60
EGFR (Non-African Amer.): 52 — ABNORMAL LOW
EGFR (Non-African Amer.): >60

## 2012-11-13 LAB — MAGNESIUM: Magnesium: 1.6 mg/dL — ABNORMAL LOW

## 2012-11-14 LAB — POTASSIUM: Potassium: 3.8 mmol/L (ref 3.5–5.1)

## 2012-11-14 LAB — CREATININE, SERUM
Creatinine: 1.25 mg/dL (ref 0.60–1.30)
EGFR (African American): >60

## 2012-11-14 LAB — MAGNESIUM: Magnesium: 1.8 mg/dL

## 2012-11-15 LAB — CREATININE, SERUM: EGFR (Non-African Amer.): 56 — ABNORMAL LOW

## 2012-11-15 LAB — MAGNESIUM: Magnesium: 1.7 mg/dL — ABNORMAL LOW

## 2012-11-18 LAB — BASIC METABOLIC PANEL
BUN: 26 mg/dL — ABNORMAL HIGH (ref 7–18)
Calcium, Total: 10.2 mg/dL — ABNORMAL HIGH (ref 8.5–10.1)
Co2: 33 mmol/L — ABNORMAL HIGH (ref 21–32)
Creatinine: 1.25 mg/dL (ref 0.60–1.30)
EGFR (African American): >60
EGFR (Non-African Amer.): 54 — ABNORMAL LOW
Glucose: 121 mg/dL — ABNORMAL HIGH (ref 65–99)
Sodium: 140 mmol/L (ref 136–145)

## 2012-11-18 LAB — MAGNESIUM: Magnesium: 1.8 mg/dL

## 2012-11-18 LAB — CBC CANCER CENTER
Basophil #: 0.1 x10 3/mm (ref 0.0–0.1)
Basophil %: 0.8 %
Eosinophil %: 2.2 %
HCT: 35.3 % — ABNORMAL LOW (ref 40.0–52.0)
Lymphocyte #: 0.8 x10 3/mm — ABNORMAL LOW (ref 1.0–3.6)
Lymphocyte %: 8.6 %
MCHC: 32.8 g/dL (ref 32.0–36.0)
RBC: 3.74 10*6/uL — ABNORMAL LOW (ref 4.40–5.90)

## 2012-11-19 ENCOUNTER — Ambulatory Visit: Payer: Self-pay | Admitting: Internal Medicine

## 2012-11-25 LAB — CBC CANCER CENTER
Basophil #: 0.1 x10 3/mm (ref 0.0–0.1)
Eosinophil %: 2.9 %
HGB: 11 g/dL — ABNORMAL LOW (ref 13.0–18.0)
MCH: 31.2 pg (ref 26.0–34.0)
Monocyte %: 5.2 %
Neutrophil %: 76.7 %
RBC: 3.53 10*6/uL — ABNORMAL LOW (ref 4.40–5.90)
RDW: 17.1 % — ABNORMAL HIGH (ref 11.5–14.5)
WBC: 6 x10 3/mm (ref 3.8–10.6)

## 2012-11-25 LAB — BASIC METABOLIC PANEL
Anion Gap: 12 (ref 7–16)
BUN: 28 mg/dL — ABNORMAL HIGH (ref 7–18)
Calcium, Total: 9.6 mg/dL (ref 8.5–10.1)
Chloride: 99 mmol/L (ref 98–107)
Co2: 33 mmol/L — ABNORMAL HIGH (ref 21–32)
Creatinine: 1.15 mg/dL (ref 0.60–1.30)
EGFR (African American): >60
EGFR (Non-African Amer.): >60
Glucose: 151 mg/dL — ABNORMAL HIGH (ref 65–99)
Osmolality: 295 (ref 275–301)
Potassium: 4.3 mmol/L (ref 3.5–5.1)
Sodium: 144 mmol/L (ref 136–145)

## 2012-11-25 LAB — MAGNESIUM: Magnesium: 1.3 mg/dL — ABNORMAL LOW

## 2012-11-25 LAB — PROTIME-INR: Prothrombin Time: 13.4 secs (ref 11.5–14.7)

## 2012-11-26 ENCOUNTER — Inpatient Hospital Stay: Payer: Self-pay | Admitting: Internal Medicine

## 2012-11-26 LAB — BASIC METABOLIC PANEL
Co2: 29 mmol/L (ref 21–32)
Creatinine: 1.04 mg/dL (ref 0.60–1.30)
EGFR (African American): >60
Glucose: 102 mg/dL — ABNORMAL HIGH (ref 65–99)
Potassium: 4.3 mmol/L (ref 3.5–5.1)

## 2012-11-26 LAB — MAGNESIUM: Magnesium: 2.2 mg/dL

## 2012-11-27 LAB — CBC WITH DIFFERENTIAL/PLATELET
Basophil #: 0 10*3/uL (ref 0.0–0.1)
Basophil %: 0.5 %
Eosinophil #: 0.1 10*3/uL (ref 0.0–0.7)
HGB: 10.6 g/dL — ABNORMAL LOW (ref 13.0–18.0)
MCV: 94 fL (ref 80–100)
Monocyte #: 0.6 x10 3/mm (ref 0.2–1.0)
Monocyte %: 9.9 %
Neutrophil #: 4.5 10*3/uL (ref 1.4–6.5)
Platelet: 80 10*3/uL — ABNORMAL LOW (ref 150–440)
RBC: 3.43 10*6/uL — ABNORMAL LOW (ref 4.40–5.90)
RDW: 16.7 % — ABNORMAL HIGH (ref 11.5–14.5)

## 2012-11-27 LAB — CREATININE, SERUM: Creatinine: 0.97 mg/dL (ref 0.60–1.30)

## 2012-11-28 ENCOUNTER — Ambulatory Visit: Payer: Self-pay | Admitting: Internal Medicine

## 2012-11-29 LAB — CBC CANCER CENTER
Basophil #: 0.1 x10 3/mm (ref 0.0–0.1)
Basophil %: 0.8 %
Lymphocyte #: 0.6 x10 3/mm — ABNORMAL LOW (ref 1.0–3.6)
MCV: 94 fL (ref 80–100)
Neutrophil #: 5.5 x10 3/mm (ref 1.4–6.5)
Platelet: 77 x10 3/mm — ABNORMAL LOW (ref 150–440)
RBC: 3.39 10*6/uL — ABNORMAL LOW (ref 4.40–5.90)
RDW: 16.4 % — ABNORMAL HIGH (ref 11.5–14.5)

## 2012-11-29 LAB — MAGNESIUM: Magnesium: 1.6 mg/dL — ABNORMAL LOW

## 2012-12-02 LAB — CBC CANCER CENTER
Eosinophil #: 0.1 x10 3/mm (ref 0.0–0.7)
HCT: 32.4 % — ABNORMAL LOW (ref 40.0–52.0)
MCHC: 33.2 g/dL (ref 32.0–36.0)
MCV: 93 fL (ref 80–100)
Monocyte #: 0.4 x10 3/mm (ref 0.2–1.0)
Monocyte %: 4.9 %
Neutrophil #: 6.7 x10 3/mm — ABNORMAL HIGH (ref 1.4–6.5)
Neutrophil %: 87.6 %
Platelet: 106 x10 3/mm — ABNORMAL LOW (ref 150–440)
RDW: 16.8 % — ABNORMAL HIGH (ref 11.5–14.5)
WBC: 7.7 x10 3/mm (ref 3.8–10.6)

## 2012-12-02 LAB — MAGNESIUM: Magnesium: 1.8 mg/dL

## 2012-12-02 LAB — CREATININE, SERUM: Creatinine: 1.12 mg/dL (ref 0.60–1.30)

## 2012-12-04 LAB — CREATININE, SERUM
Creatinine: 1.32 mg/dL — ABNORMAL HIGH (ref 0.60–1.30)
EGFR (African American): 59 — ABNORMAL LOW

## 2012-12-09 LAB — CBC CANCER CENTER
Basophil #: 0 "x10 3/mm "
Basophil %: 0.7 %
Eosinophil #: 0.1 "x10 3/mm "
Eosinophil %: 1.1 %
HCT: 30.6 % — ABNORMAL LOW
HGB: 10.1 g/dL — ABNORMAL LOW
Lymphocyte %: 11.7 %
Lymphs Abs: 0.6 "x10 3/mm " — ABNORMAL LOW
MCH: 30 pg
MCHC: 33 g/dL
MCV: 91 fL
Monocyte #: 0.3 "x10 3/mm "
Monocyte %: 5.9 %
Neutrophil #: 4 "x10 3/mm "
Neutrophil %: 80.6 %
Platelet: 108 "x10 3/mm " — ABNORMAL LOW
RBC: 3.36 "x10 6/mm " — ABNORMAL LOW
RDW: 16.5 % — ABNORMAL HIGH
WBC: 4.9 "x10 3/mm "

## 2012-12-09 LAB — BASIC METABOLIC PANEL
BUN: 35 mg/dL — ABNORMAL HIGH (ref 7–18)
Calcium, Total: 9.5 mg/dL (ref 8.5–10.1)
Chloride: 103 mmol/L (ref 98–107)
Creatinine: 1.32 mg/dL — ABNORMAL HIGH (ref 0.60–1.30)
EGFR (African American): 59 — ABNORMAL LOW
EGFR (Non-African Amer.): 51 — ABNORMAL LOW
Glucose: 107 mg/dL — ABNORMAL HIGH (ref 65–99)
Osmolality: 284 (ref 275–301)

## 2012-12-09 LAB — MAGNESIUM: Magnesium: 1.6 mg/dL — ABNORMAL LOW

## 2012-12-12 LAB — CBC CANCER CENTER
Basophil %: 0 %
Eosinophil %: 0 %
HCT: 29 % — ABNORMAL LOW (ref 40.0–52.0)
Lymphocyte #: 0.4 x10 3/mm — ABNORMAL LOW (ref 1.0–3.6)
MCHC: 32.4 g/dL (ref 32.0–36.0)
MCV: 93 fL (ref 80–100)
Monocyte %: 0.8 %
Neutrophil %: 90.6 %
Platelet: 81 x10 3/mm — ABNORMAL LOW (ref 150–440)

## 2012-12-12 LAB — MAGNESIUM: Magnesium: 2.1 mg/dL

## 2012-12-16 LAB — CBC CANCER CENTER
Basophil #: 0 x10 3/mm (ref 0.0–0.1)
Eosinophil #: 0 x10 3/mm (ref 0.0–0.7)
HCT: 28.1 % — ABNORMAL LOW (ref 40.0–52.0)
HGB: 9.2 g/dL — ABNORMAL LOW (ref 13.0–18.0)
Lymphocyte #: 0.5 x10 3/mm — ABNORMAL LOW (ref 1.0–3.6)
Lymphocyte %: 34.4 %
MCH: 29.8 pg (ref 26.0–34.0)
MCHC: 32.8 g/dL (ref 32.0–36.0)
MCV: 91 fL (ref 80–100)
Monocyte #: 0.1 x10 3/mm — ABNORMAL LOW (ref 0.2–1.0)
Monocyte %: 5.4 %
RBC: 3.09 10*6/uL — ABNORMAL LOW (ref 4.40–5.90)
RDW: 16.4 % — ABNORMAL HIGH (ref 11.5–14.5)
WBC: 1.5 x10 3/mm — CL (ref 3.8–10.6)

## 2012-12-16 LAB — POTASSIUM: Potassium: 3.8 mmol/L (ref 3.5–5.1)

## 2012-12-16 LAB — MAGNESIUM: Magnesium: 1.4 mg/dL — ABNORMAL LOW

## 2012-12-17 LAB — CBC CANCER CENTER
Basophil #: 0 x10 3/mm (ref 0.0–0.1)
Eosinophil #: 0 x10 3/mm (ref 0.0–0.7)
Eosinophil %: 1.2 %
HGB: 8.9 g/dL — ABNORMAL LOW (ref 13.0–18.0)
Lymphocyte #: 0.6 x10 3/mm — ABNORMAL LOW (ref 1.0–3.6)
Lymphocyte %: 26.8 %
MCH: 30.4 pg (ref 26.0–34.0)
MCHC: 33 g/dL (ref 32.0–36.0)
MCV: 92 fL (ref 80–100)
Monocyte #: 0.2 x10 3/mm (ref 0.2–1.0)
Monocyte %: 8.1 %
Neutrophil %: 63.1 %
Platelet: 36 x10 3/mm — ABNORMAL LOW (ref 150–440)
RBC: 2.92 10*6/uL — ABNORMAL LOW (ref 4.40–5.90)
RDW: 16.4 % — ABNORMAL HIGH (ref 11.5–14.5)
WBC: 2.2 x10 3/mm — ABNORMAL LOW (ref 3.8–10.6)

## 2012-12-18 LAB — CBC CANCER CENTER
Eosinophil: 1 %
HCT: 27.5 % — ABNORMAL LOW (ref 40.0–52.0)
HGB: 9 g/dL — ABNORMAL LOW (ref 13.0–18.0)
MCH: 30.2 pg (ref 26.0–34.0)
MCHC: 32.7 g/dL (ref 32.0–36.0)
Metamyelocyte: 1 %
Monocytes: 6 %
NRBC/100 WBC: 9 /100
Other Cells Blood: 1 %
RDW: 16.9 % — ABNORMAL HIGH (ref 11.5–14.5)
WBC: 3.7 x10 3/mm — ABNORMAL LOW (ref 3.8–10.6)

## 2012-12-18 LAB — EXPECTORATED SPUTUM ASSESSMENT W GRAM STAIN, RFLX TO RESP C

## 2012-12-19 LAB — MAGNESIUM: Magnesium: 1.8 mg/dL

## 2012-12-20 ENCOUNTER — Ambulatory Visit: Payer: Self-pay | Admitting: Internal Medicine

## 2012-12-23 LAB — CBC CANCER CENTER
Basophil %: 0.2 %
Eosinophil #: 0 x10 3/mm (ref 0.0–0.7)
Eosinophil %: 0.3 %
HCT: 27.2 % — ABNORMAL LOW (ref 40.0–52.0)
HGB: 8.9 g/dL — ABNORMAL LOW (ref 13.0–18.0)
Lymphocyte #: 0.5 x10 3/mm — ABNORMAL LOW (ref 1.0–3.6)
MCHC: 32.6 g/dL (ref 32.0–36.0)
MCV: 93 fL (ref 80–100)
Monocyte %: 5.1 %
Neutrophil #: 5.8 x10 3/mm (ref 1.4–6.5)
Neutrophil %: 86.9 %
RDW: 17.6 % — ABNORMAL HIGH (ref 11.5–14.5)
WBC: 6.7 x10 3/mm (ref 3.8–10.6)

## 2012-12-23 LAB — CREATININE, SERUM
Creatinine: 1.52 mg/dL — ABNORMAL HIGH (ref 0.60–1.30)
EGFR (African American): 50 — ABNORMAL LOW
EGFR (Non-African Amer.): 43 — ABNORMAL LOW

## 2012-12-23 LAB — MAGNESIUM: Magnesium: 2.1 mg/dL

## 2012-12-30 ENCOUNTER — Ambulatory Visit: Payer: Self-pay | Admitting: Family Medicine

## 2012-12-30 LAB — CBC CANCER CENTER
Basophil #: 0 x10 3/mm (ref 0.0–0.1)
Basophil %: 0.1 %
Eosinophil %: 0.2 %
MCH: 30.4 pg (ref 26.0–34.0)
MCHC: 33 g/dL (ref 32.0–36.0)
MCV: 92 fL (ref 80–100)
Monocyte #: 0.3 x10 3/mm (ref 0.2–1.0)
Monocyte %: 5.5 %
Neutrophil #: 5.3 x10 3/mm (ref 1.4–6.5)
Neutrophil %: 86.9 %
Platelet: 145 x10 3/mm — ABNORMAL LOW (ref 150–440)
RDW: 17.4 % — ABNORMAL HIGH (ref 11.5–14.5)

## 2012-12-30 LAB — MAGNESIUM: Magnesium: 1.7 mg/dL — ABNORMAL LOW

## 2012-12-30 LAB — CREATININE, SERUM
Creatinine: 1.2 mg/dL (ref 0.60–1.30)
EGFR (African American): >60
EGFR (Non-African Amer.): 57 — ABNORMAL LOW

## 2012-12-30 LAB — POTASSIUM: Potassium: 4.3 mmol/L (ref 3.5–5.1)

## 2013-01-06 LAB — BASIC METABOLIC PANEL
Anion Gap: 10 (ref 7–16)
BUN: 47 mg/dL — ABNORMAL HIGH (ref 7–18)
Calcium, Total: 9.2 mg/dL (ref 8.5–10.1)
Chloride: 102 mmol/L (ref 98–107)
Co2: 29 mmol/L (ref 21–32)
Creatinine: 1.58 mg/dL — ABNORMAL HIGH (ref 0.60–1.30)
EGFR (African American): 48 — ABNORMAL LOW
EGFR (Non-African Amer.): 41 — ABNORMAL LOW
Glucose: 111 mg/dL — ABNORMAL HIGH (ref 65–99)
Osmolality: 294 (ref 275–301)
Potassium: 4.6 mmol/L (ref 3.5–5.1)
Sodium: 141 mmol/L (ref 136–145)

## 2013-01-06 LAB — CBC CANCER CENTER
Basophil #: 0 x10 3/mm (ref 0.0–0.1)
Basophil %: 0.1 %
Eosinophil %: 0.2 %
HCT: 22.8 % — ABNORMAL LOW (ref 40.0–52.0)
HGB: 7.2 g/dL — ABNORMAL LOW (ref 13.0–18.0)
Lymphocyte %: 11 %
MCH: 29.6 pg (ref 26.0–34.0)
MCHC: 31.7 g/dL — ABNORMAL LOW (ref 32.0–36.0)
MCV: 93 fL (ref 80–100)
Monocyte #: 0.2 x10 3/mm (ref 0.2–1.0)
Platelet: 59 x10 3/mm — ABNORMAL LOW (ref 150–440)
RBC: 2.44 10*6/uL — ABNORMAL LOW (ref 4.40–5.90)

## 2013-01-06 LAB — MAGNESIUM: Magnesium: 1.9 mg/dL

## 2013-01-08 LAB — CBC CANCER CENTER
Basophil #: 0 x10 3/mm (ref 0.0–0.1)
Basophil %: 0.2 %
Eosinophil %: 0.3 %
Lymphocyte #: 0.5 x10 3/mm — ABNORMAL LOW (ref 1.0–3.6)
MCH: 30.8 pg (ref 26.0–34.0)
MCHC: 33.9 g/dL (ref 32.0–36.0)
Monocyte #: 0.2 x10 3/mm (ref 0.2–1.0)
Monocyte %: 4.2 %
Neutrophil #: 4.6 x10 3/mm (ref 1.4–6.5)
Platelet: 54 x10 3/mm — ABNORMAL LOW (ref 150–440)
RBC: 2.85 10*6/uL — ABNORMAL LOW (ref 4.40–5.90)
RDW: 17.6 % — ABNORMAL HIGH (ref 11.5–14.5)

## 2013-01-08 LAB — APTT: Activated PTT: 32.3 secs (ref 23.6–35.9)

## 2013-01-08 LAB — PROTIME-INR
INR: 1
Prothrombin Time: 13.7 secs (ref 11.5–14.7)

## 2013-01-09 LAB — CREATININE, SERUM
EGFR (African American): >60
EGFR (Non-African Amer.): >60

## 2013-01-09 LAB — CBC WITH DIFFERENTIAL/PLATELET
Comment - H1-Com3: NORMAL
HCT: 26.5 % — ABNORMAL LOW (ref 40.0–52.0)
HGB: 8.9 g/dL — ABNORMAL LOW (ref 13.0–18.0)
Lymphocytes: 11 %
MCV: 92 fL (ref 80–100)
Metamyelocyte: 1 %
Monocytes: 6 %
Platelet: 61 10*3/uL — ABNORMAL LOW (ref 150–440)
RBC: 2.89 10*6/uL — ABNORMAL LOW (ref 4.40–5.90)
RDW: 17.9 % — ABNORMAL HIGH (ref 11.5–14.5)

## 2013-01-10 ENCOUNTER — Inpatient Hospital Stay: Payer: Self-pay | Admitting: Internal Medicine

## 2013-01-20 ENCOUNTER — Ambulatory Visit: Payer: Self-pay | Admitting: Internal Medicine

## 2013-01-20 DEATH — deceased

## 2014-06-23 IMAGING — CT CT CHEST W/ CM
1 series · 14 of 32 positions shown, 18 images · non-contrast
Comparison: none

REASON FOR EXAM: CALL REPORT PAGE [phone] LUNG MASS
COMMENTS:

[Series 2: soft tissue · axial · 0.75mm/px · z∈[-386,-108]mm · 14 of 111 slices shown, 18 images]
[im 9/111  mediastinal]
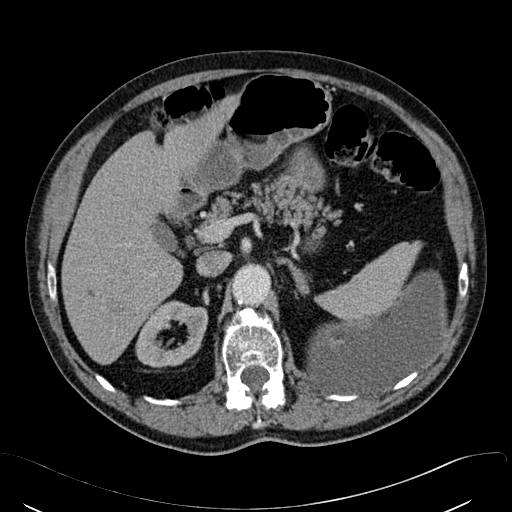
[im 9/111  lung]
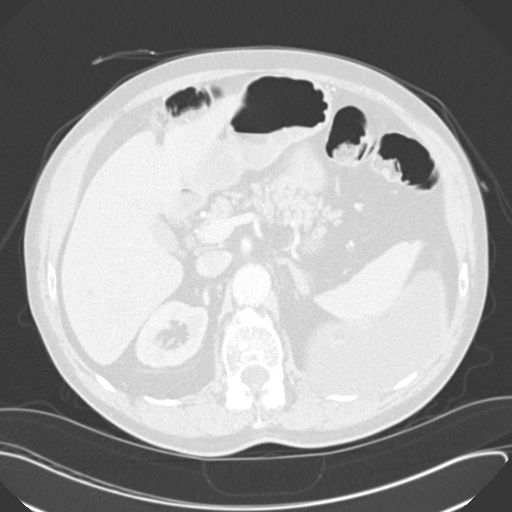
[im 17/111  lung]
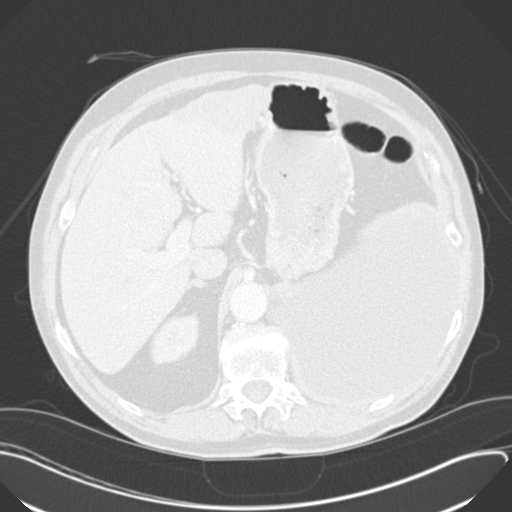
[im 23/111  lung]
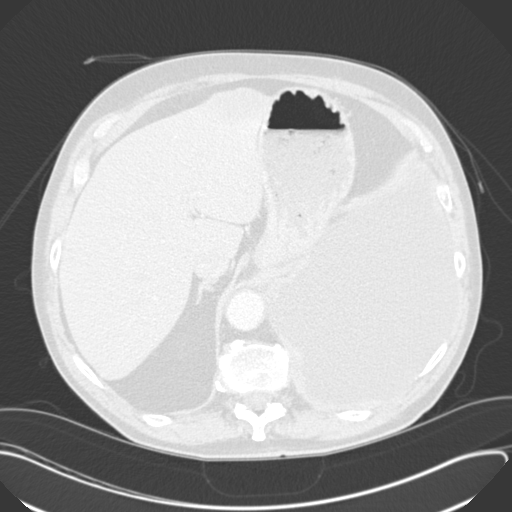
[im 29/111  lung]
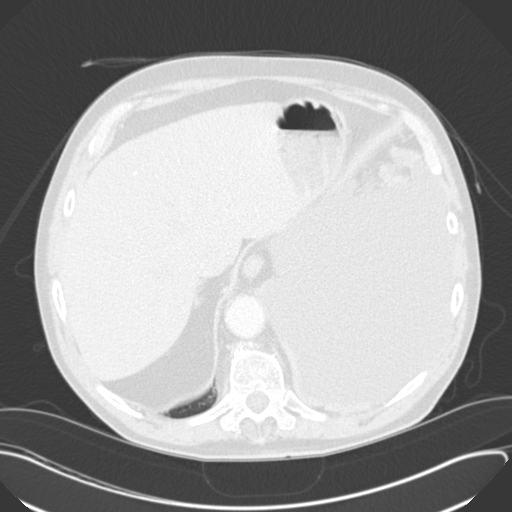
[im 37/111  mediastinal]
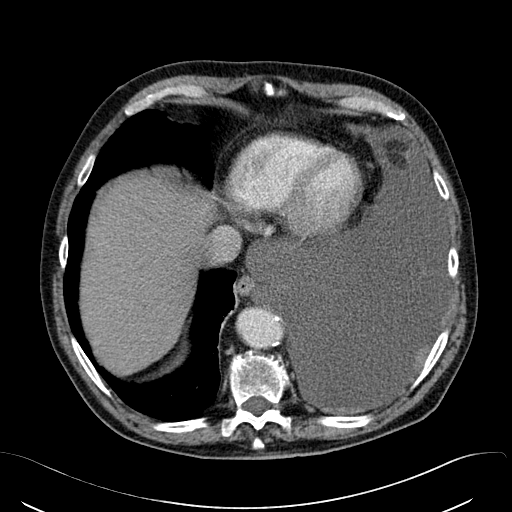
[im 37/111  lung]
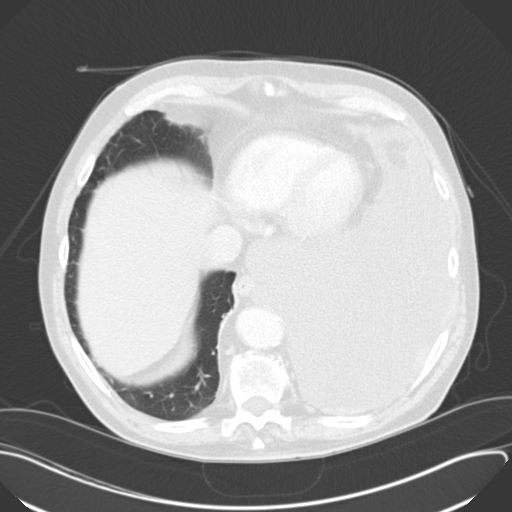
[im 45/111  lung]
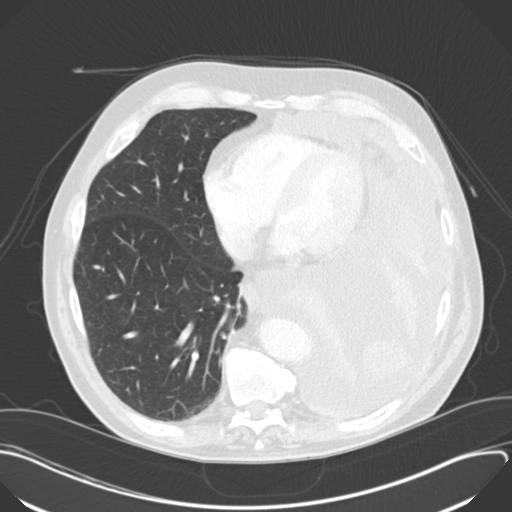
[im 53/111  lung]
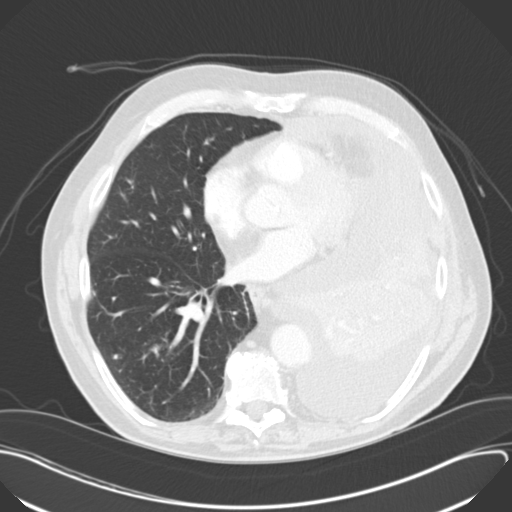
[im 62/111  lung]
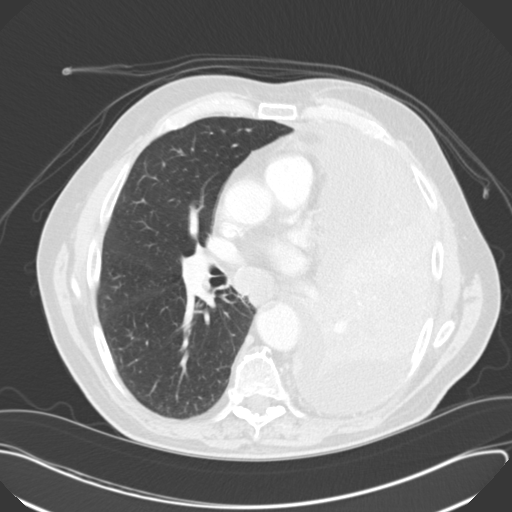
[im 67/111  mediastinal]
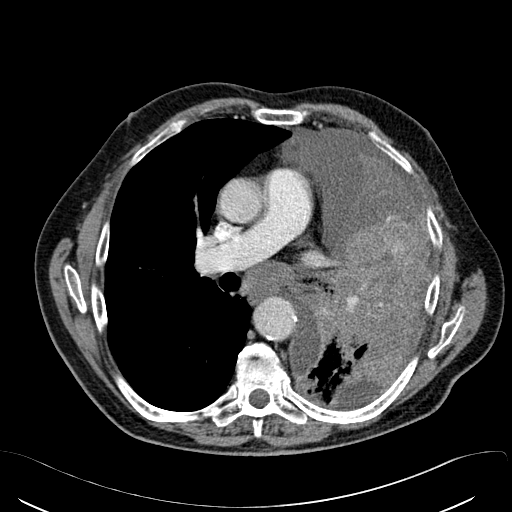
[im 67/111  lung]
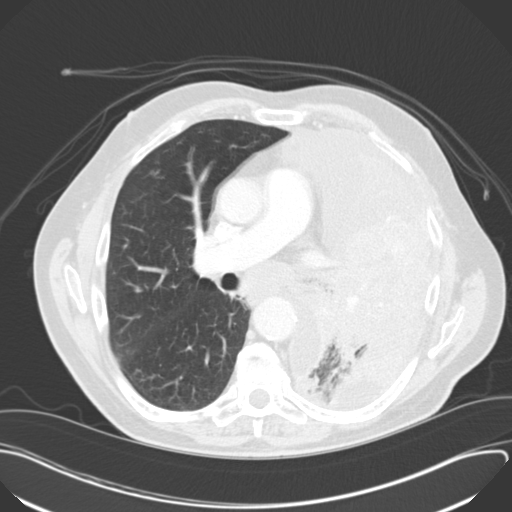
[im 74/111  lung]
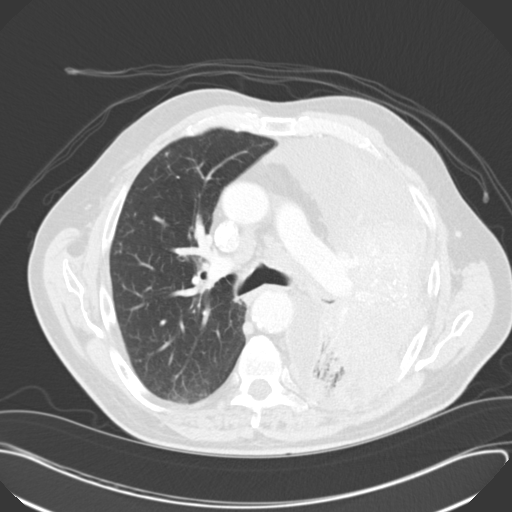
[im 82/111  lung]
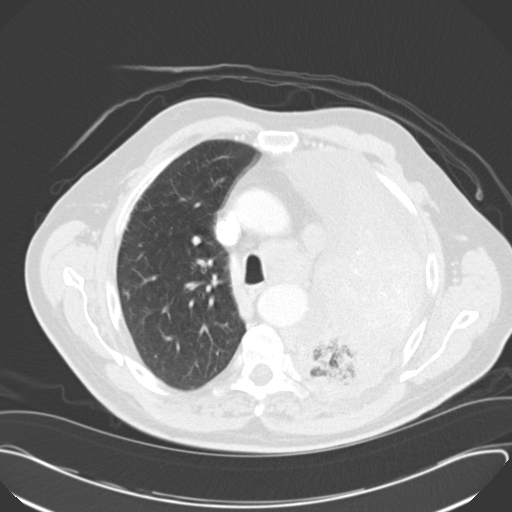
[im 89/111  lung]
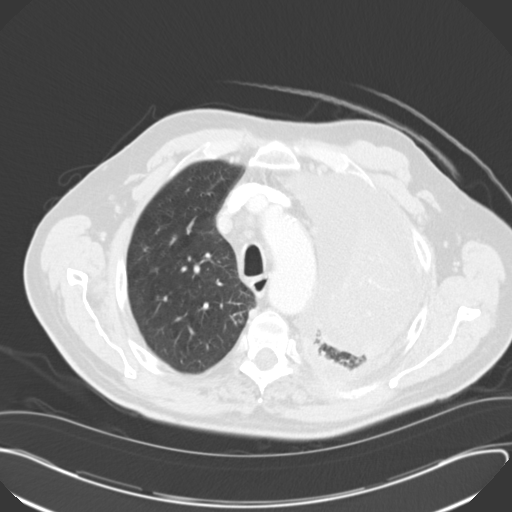
[im 94/111  mediastinal]
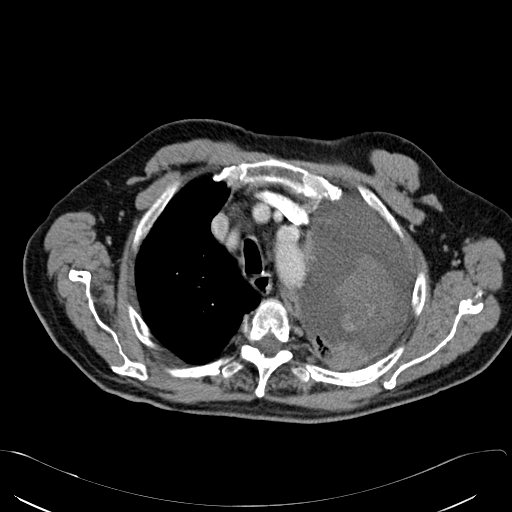
[im 94/111  lung]
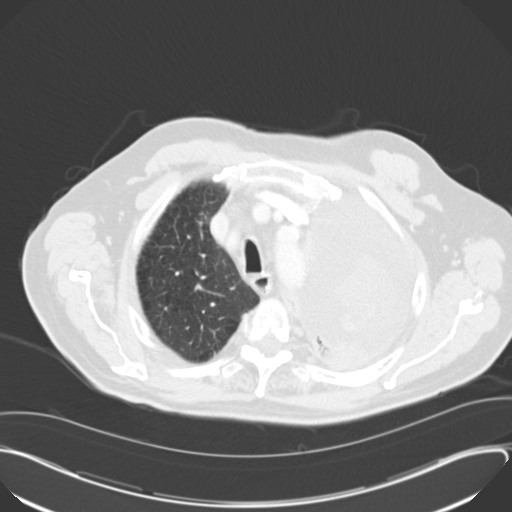
[im 102/111  lung]
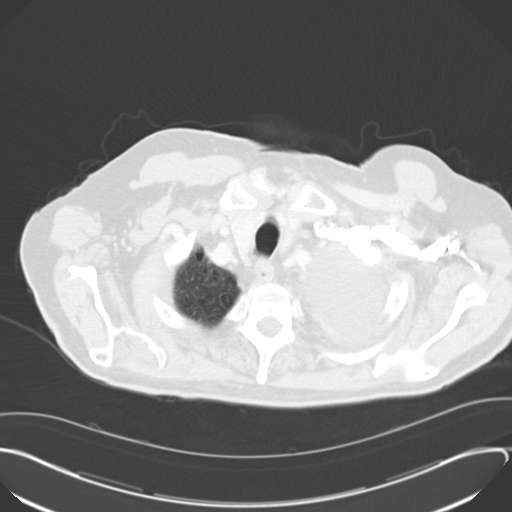

[14 of 32 positions shown; findings below may reference images not displayed]

PROCEDURE:     CT  - CT CHEST WITH CONTRAST  - June 03, 2012  [DATE]

RESULT:     Axial CT scanning was performed through the chest with
reconstructions at 3 mm intervals and slice thicknesses following
intravenous administration of 75 cc of Dsovue-GI2.

The left hemithorax is nearly totally fluid and soft tissue density filled.
A small amount of aerated lung is demonstrated in the posterior aspect of
the hemithorax. There is a large pleural effusion. There is a large area of
consolidated lung with punctate calcification versus enhancement in the
perihilar region. This measures approximately 7 cm AP x 6 cm transversely.
There is a large AP window mass measuring 3.5 cm in diameter. There is a
second AP window lymph node measuring 2.3 cm in diameter. A subcarinal lymph
node measures 3.5 cm in greatest dimension. There is a lymph node medial to
the superior vena cava on image 37 which measures 2.2 cm in greatest
dimension.

At lung window settings the aerated right lung exhibits no suspicious
masses. There is no alveolar infiltrate.

The cardiac chambers are normal in size. The caliber of the thoracic aorta
is normal. Contrast within the right pulmonary artery exhibits no filling
defects. The central pulmonary arterial tree on the left is patent. The
thoracic esophagus is not distended.

Within the upper abdomen there are abnormal hypodensities in the right and
left hepatic lobe worrisome for metastatic disease. In the right hepatic
lobe this mass measures 3.8 cm in diameter. In the left lobe the mass
measures 5.3 x 3.2 cm with an adjacent smaller nodule. No adrenal masses are
demonstrated. There is likely an exophytic cyst in the midpole of the right
kidney. There is failure to taper of the upper and mid abdominal aorta.

The thoracic vertebral bodies are preserved in height. No lytic or blastic
rib lesion is demonstrated.
IMPRESSION: 1. There is a large mass in the left midlung with surrounding atelectasis
and pleural fluid. There are large mediastinal masses consistent with
lymphadenopathy. There are masses within the liver worrisome for metastatic
disease.
2. The right lung is well-expanded and grossly clear.

Dr. Labastida was paged with this report at [DATE] p.m. on June 03, 2012.

[REDACTED]

## 2014-07-08 IMAGING — CR DG CHEST 2V
1 series · 3 of 3 positions shown · non-contrast
Comparison: none

REASON FOR EXAM: increased dyspnea / cough with sputum
COMMENTS:   LMP: (Male)

[Series 1: x chest ap · 0.14mm/px · 3 of 3 slices shown]
[im 1/3]
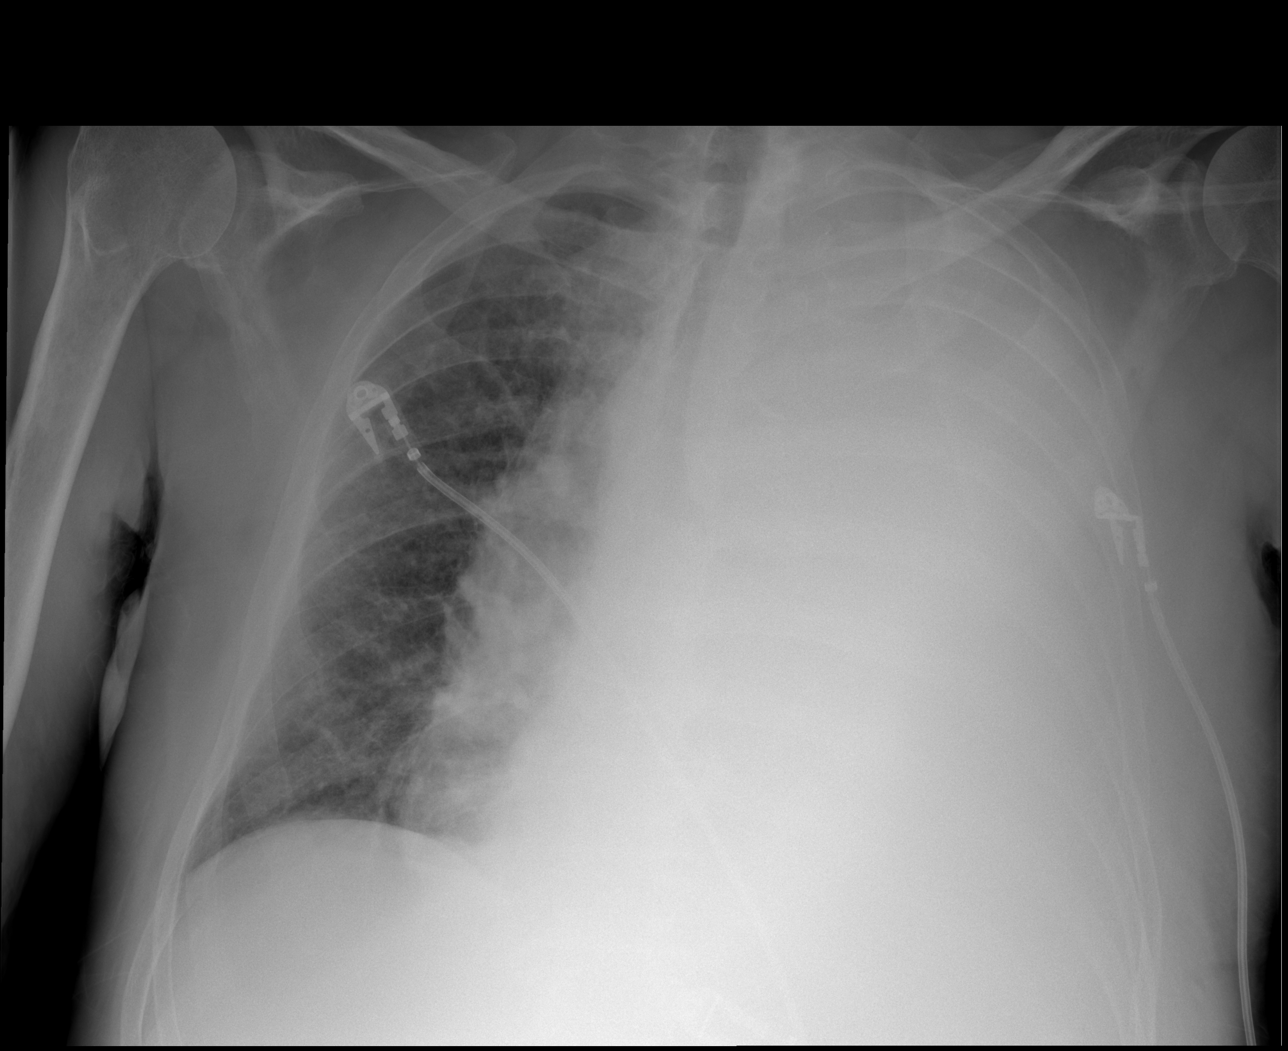
[im 2/3]
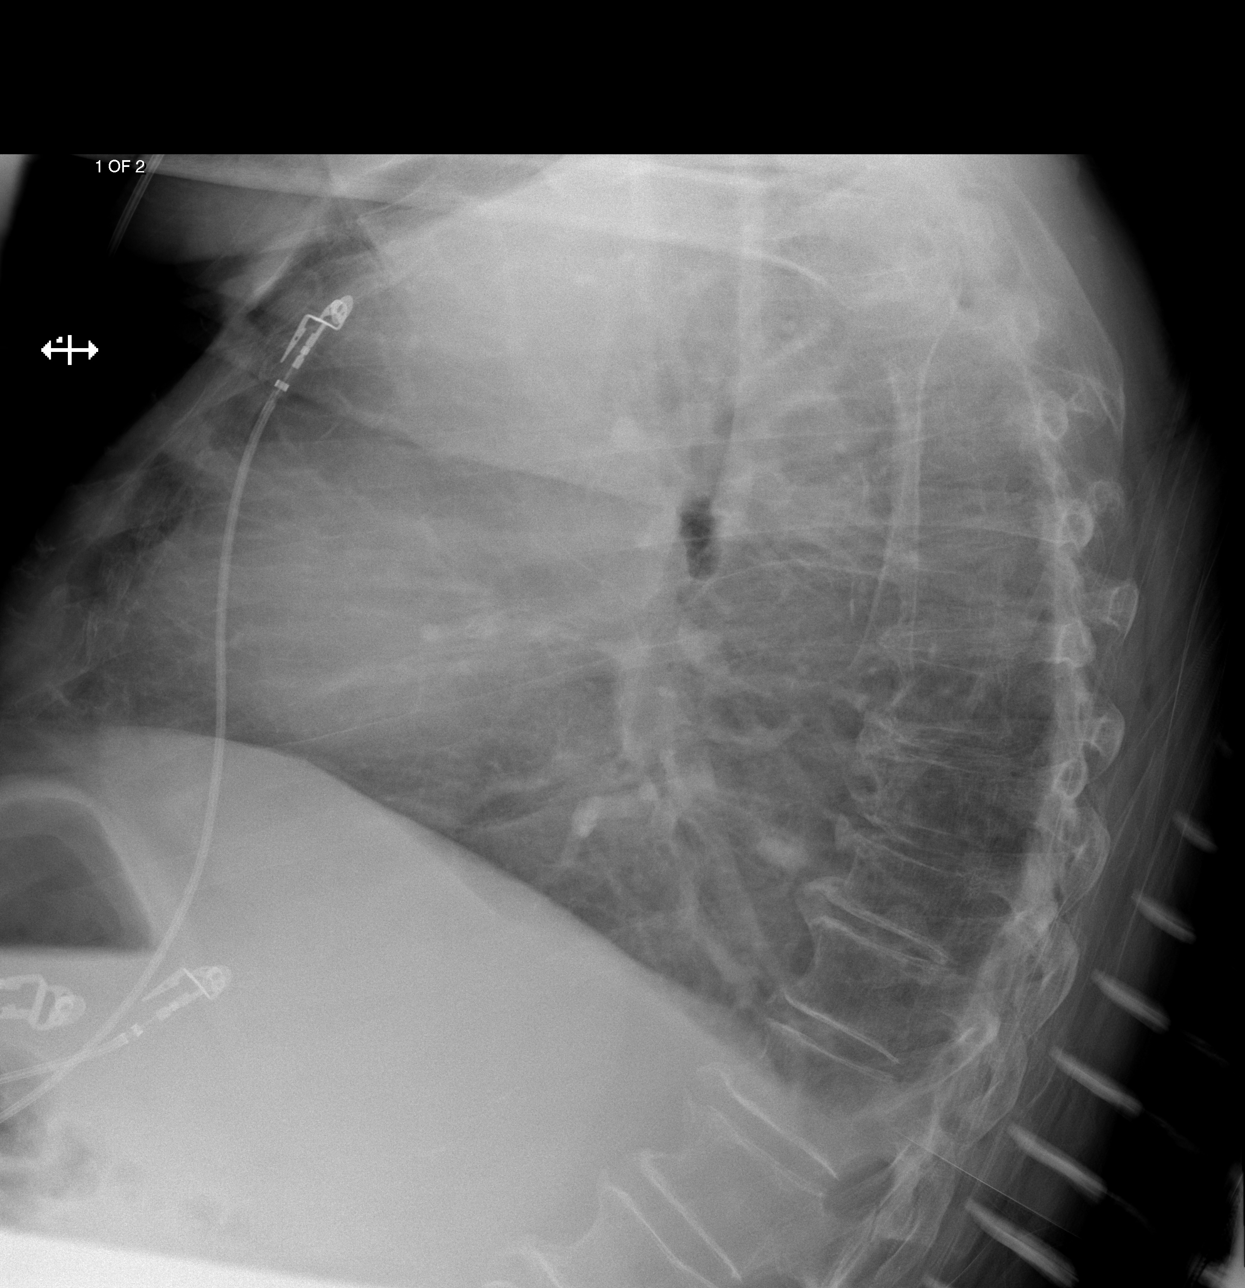
[im 3/3]
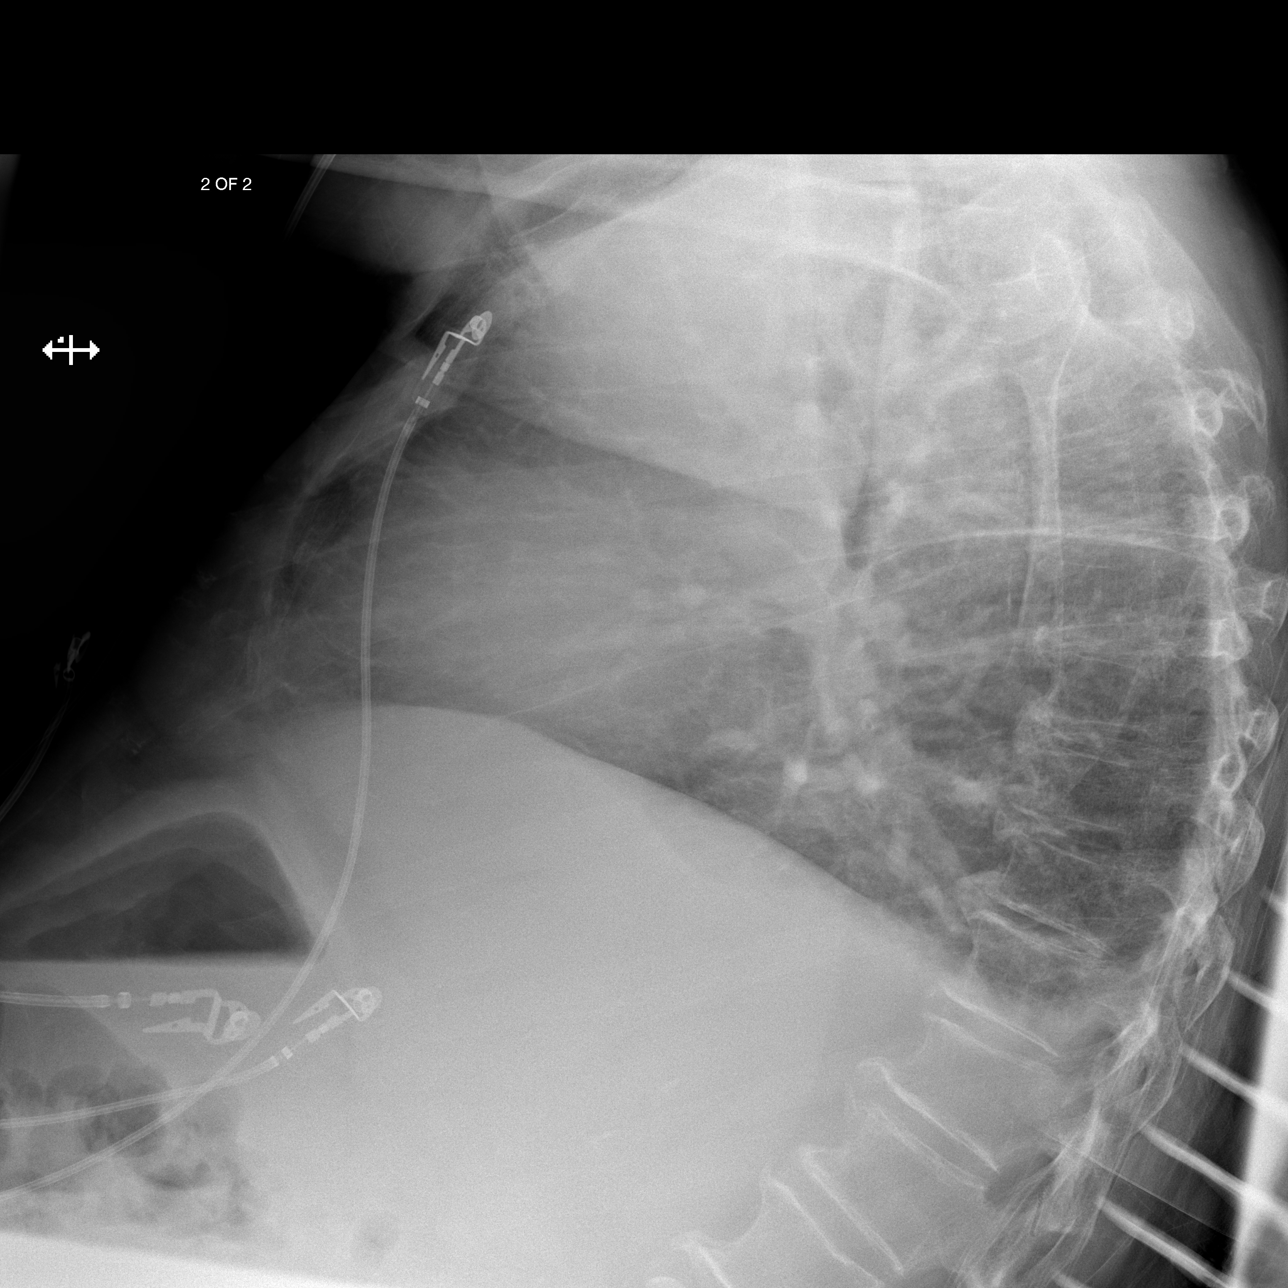

[3 of 3 positions shown; findings below may reference images not displayed]

PROCEDURE:     DXR - DXR CHEST PA (OR AP) AND LATERAL  - June 18, 2012  [DATE]

RESULT:     There is complete opacification of the left hemithorax. Previous
CT on [DATE] shows a large left pleural effusion with essentially
consolidation of the entire left lung. The heart size cannot be assessed.
The right lung appears to be grossly clear with prominence of pulmonary
vasculature.
IMPRESSION: Continued complete opacification of the left hemithorax
with a known large left pleural effusion. The right lung is grossly clear.

[REDACTED]

## 2014-07-30 IMAGING — CR DG CHEST 2V
1 series · 3 of 3 positions shown · non-contrast
Comparison: none

REASON FOR EXAM: left Lung Ca
COMMENTS:

PROCEDURE:     DXR - DXR CHEST PA (OR AP) AND LATERAL  - July 10, 2012  [DATE]
RESULT:     Comparison: 06/26/2012

[Series 1: pa · 0.17mm/px · 3 of 3 slices shown]
[im 1/3]
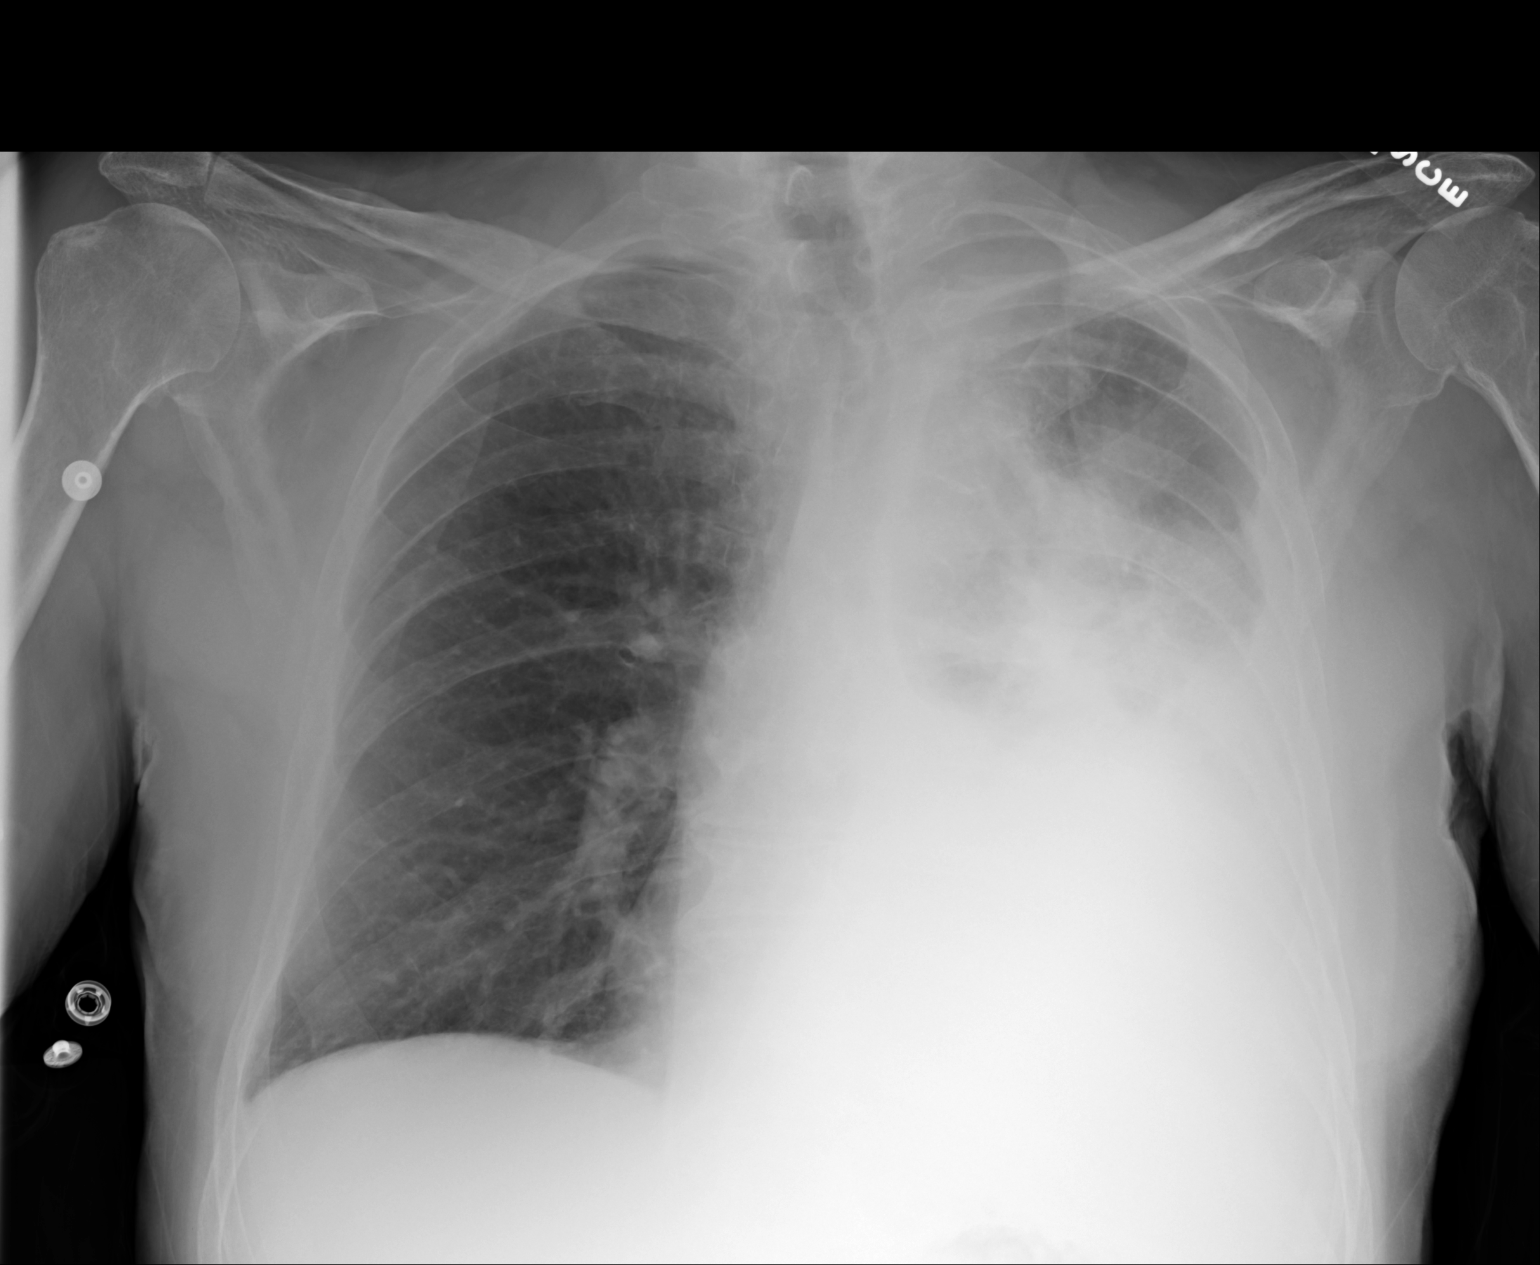
[im 2/3]
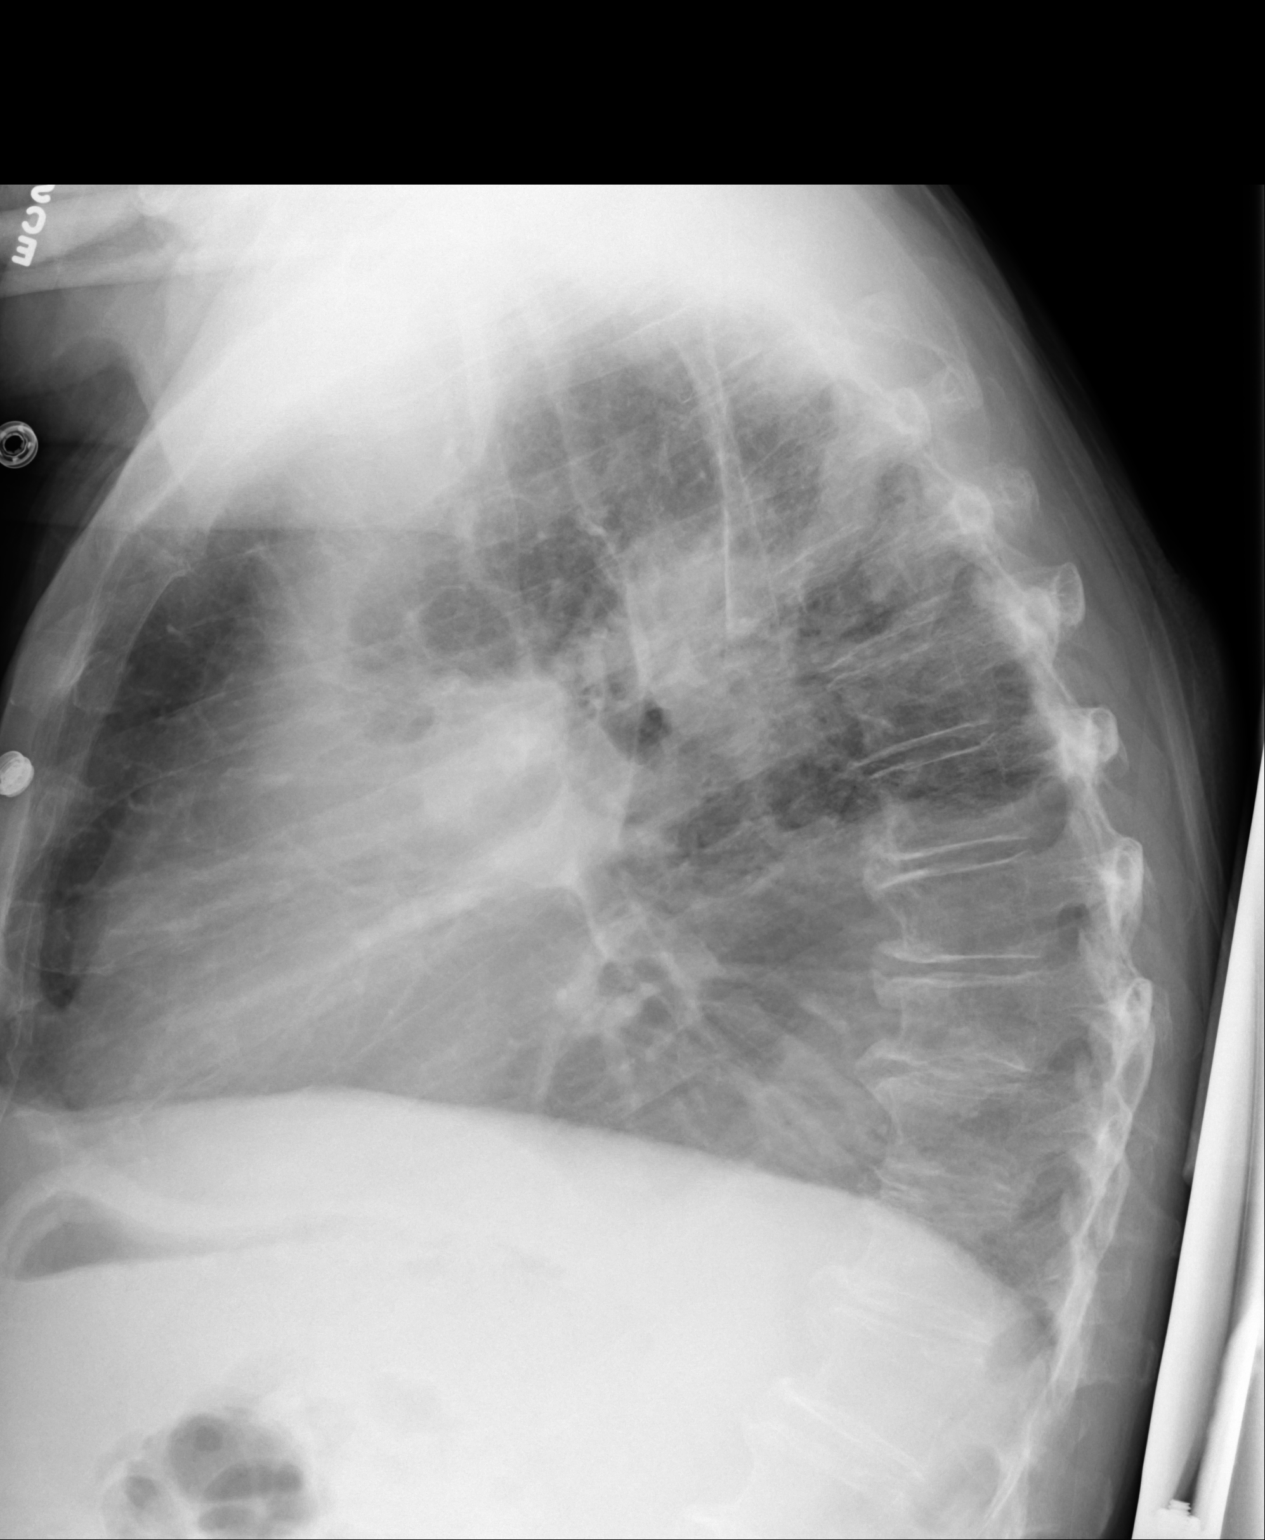
[im 3/3]
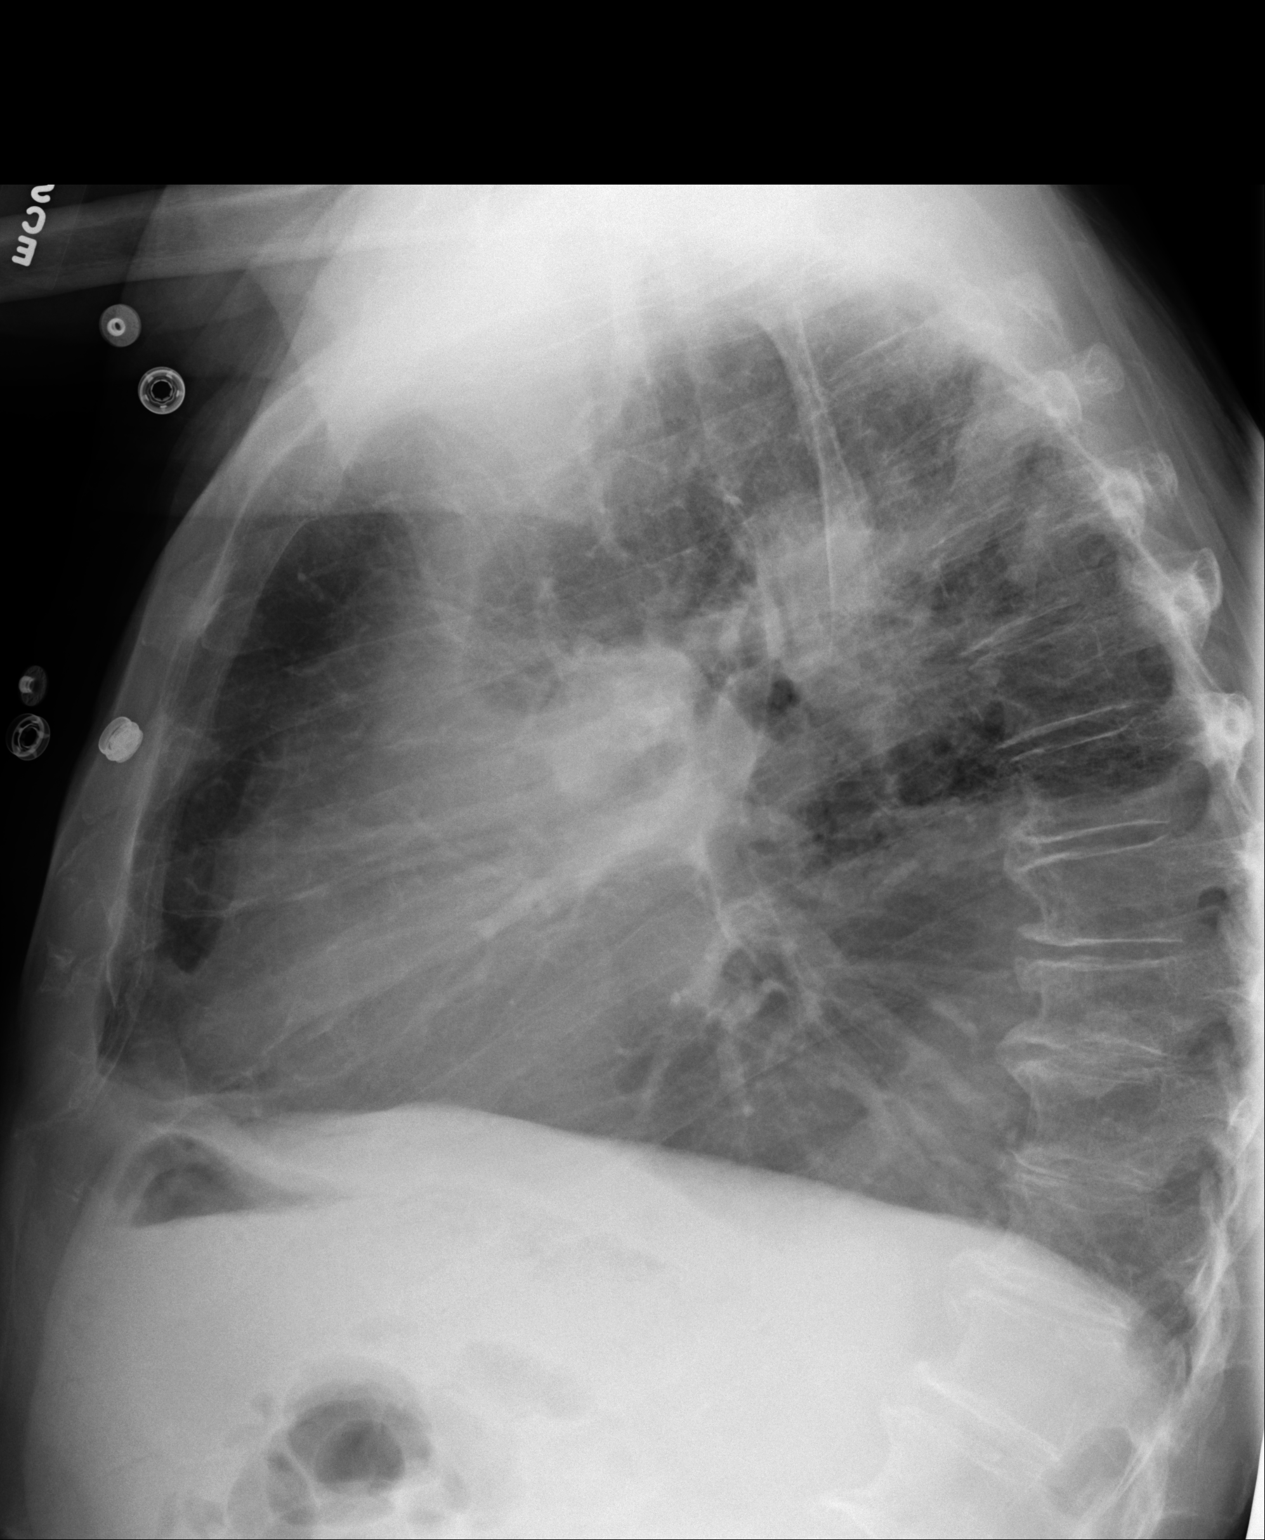

[3 of 3 positions shown; findings below may reference images not displayed]

FINDINGS: PA and lateral chest radiographs are provided. There is a moderate-large
left pleural effusion. There is increased left upper a lobe airspace opacity
which may resent pneumonitis versus malignancy. In the right lung is clear.
There is slightly improved aeration of the left upper lobe compared with the
prior examination of 06/26/2012. Stable heart and mediastinum.  The osseous
structures are unremarkable.
IMPRESSION: Please see above.

[REDACTED]

## 2014-09-11 NOTE — Consult Note (Signed)
PATIENT NAME:  Tony Farmer, Tony Farmer MR#:  [id number] DATE OF BIRTH:  26-Jan-1932  DATE OF CONSULTATION:  06/17/2012  REFERRING PHYSICIAN:  Benita Gutter, MD CONSULTING PHYSICIAN:  Carney Corners. Lauriel Helin, MD  REASON FOR CONSULTATION: Small-cell lung cancer, left lung collapse, metastasis to the liver, hyperkalemia and COPD.  HISTORY OF PRESENT ILLNESS: Mr. Budreau is a 81 year old pleasant Caucasian male with history of chronic obstructive pulmonary disease, tobacco abuse, small-cell lung cancer with obstructive process resulting in left lung collapse and metastasis to the liver. The patient was admitted on January 24th and underwent chemotherapy. Currently he is short of breath, he is wheezing and tight in his chest. He has now started coughing and bringing sputum that is yellowish to gray thick sputum. Other than wheezing and shortness of breath and being tight in his chest, the patient has no other complaint and denies any pain anywhere in his body. Additionally noted that his potassium was elevated, reaching 6.3. Earlier today he had received Kayexalate and subsequent potassium was marginally lower at 5.9, 5.8, 5.9 and now it is 6.1. His uric acid also went up to 7.7. This may indicate he has underlying tumor lysis syndrome.   REVIEW OF SYSTEMS:  CONSTITUTIONAL: No fever and no chills, but he has mild fatigue.  EYES: No blurring of vision. No double vision.  ENT: No hearing impairment. No sore throat. No dysphagia.  CARDIOVASCULAR: No chest pain but he has shortness of breath. No peripheral edema. No palpitations.  RESPIRATORY: He is short of breath, wheezing and has cough with sputum production. He just started coughing the yellowish-brown to gray sputum.  GASTROINTESTINAL: No abdominal pain, no vomiting and no diarrhea.  GENITOURINARY: No dysuria. No frequency of urination.  MUSCULOSKELETAL: No joint pain or swelling. No muscular pain or swelling.  INTEGUMENTARY: No skin rash. No ulcers.  NEUROLOGY: No focal  weakness. No seizure activity. No headache.  PSYCHIATRY: No anxiety. No depression.  ENDOCRINE: No polyuria or polydipsia. No heat or cold intolerance.   PAST MEDICAL HISTORY: 1. 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  2. Chronic obstructive pulmonary disease with acute exacerbation currently.  3. Systemic hypertension. 4. Hyperlipidemia.  5. History of left thigh central vein occlusion.   PAST SURGICAL HISTORY: None.   SOCIAL HABITS: Chronic smoker, currently smoking one-third of a pack a day since age of 50. He stated that he quit smoking last week. No history of alcohol or drug abuse.   SOCIAL HISTORY: He is retired. 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 The patient is married and living with his wife. He has one child.   FAMILY HISTORY: Both parents are deceased. His mother died in 10/21/2003; she was healthy. His father died in October 20, 1997; he had cancer of the shoulder.   ADMISSION MEDICATIONS: Diltiazem 240 mg a day, lisinopril 20 mg and hydrochlorothiazide 25 mg. All these medications are on hold since blood pressure was low. He is on bronchodilator therapy using albuterol with ipratropium. He is on oxygen on 2.5 liters nasal cannula. IV Solu-Medrol 80 mg twice a day. He had received chemotherapy. He is also receiving nicotine patch. He is now receiving IV fluids, which was reduced now to 160 mL/hour.   ALLERGIES: No known drug allergies.   PHYSICAL EXAMINATION: VITAL SIGNS: Blood pressure 148/92, pulse 112 per minute, respiratory rate 22, temperature 96.1 and O2 saturation 90% earlier but right  now he is lying down flat. The nurse aid was cleaning him as he had bowel movement. His face looks congestively plethoric and his oxygen saturation is down to 85% to 86%. Readjusting his position in bed, in upright position, gave him some improvement. He is breathing now better. His face color  improved and the oxygen is now increased a little bit.  GENERAL APPEARANCE: Elderly pleasant male lying in bed, appears short of breath.  HEAD AND NECK: No pallor and no icterus. He was mildly cyanotic earlier, but now his face color is better, when he was placed in the upright position.  EARS, NOSE AND THROAT: Ear examination revealed normal hearing, no discharge and no lesions. Nasal mucosa was normal, no ulcers, no discharge and no bleeding. On oral examination, lips and tongue were normal. No oral thrush. No ulcers. Eye examination revealed normal iris and conjunctivae. Pupils about 6 to 7 mm, round, equal and reactive to light.  NECK: Supple. Trachea at midline. No thyromegaly. No masses.  HEART: Normal S1 and S2. No S3 or S4. No murmur. No gallop. Decreased heart sounds. No carotid bruits.  RESPIRATORY: Absent breathing sounds on the left side of the chest with dullness in percussion. On the right side of the chest, there is prolonged expiratory phase and diffuse wheezing. No crackles.  ABDOMEN: Obese, soft and distended without tenderness. No hepatosplenomegaly. No masses. No hernias.  SKIN: No ulcers. No subcutaneous nodules.  MUSCULOSKELETAL: No joint swelling. No clubbing.  NEUROLOGIC: Cranial nerves II through XII are intact. No focal motor deficit.  PSYCHIATRY: The patient is alert and oriented x 3. Mood and affect were normal.   LABS/RADIOLOGIC STUDIES: He had CAT scan of the chest on January 13th that showed total whitening of the left lung. There is a large mass in the left mid lung with surrounding atelectasis and pleural fluid and large mediastinal masses consistent with lymphadenopathy. There are masses within the liver as well. The right lung appears well expanded. No consolidation on the right.   Serum glucose 135, BUN 45, creatinine 0.9, sodium 139, potassium 6.1, bicarbonate 35, anion gap 4 and calcium 8.8. Uric acid was 6.9, now 7.7. Phosphorous was 2.6, now 3.7. CBC showed  white count of 11,000, hemoglobin 11.9, hematocrit 38 and platelet count 239. Liver function tests showed albumin of 2.7, bilirubin 0.2, alkaline phosphatase 140, AST 38 and ALT 36.   ASSESSMENT: 1. Shortness of breath secondary to the following. 2. 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  3. Acute exacerbation of chronic obstructive pulmonary disease.  4. Hyperkalemia likely secondary to tumor lysis syndrome. 5. Systemic hypertension. Blood pressure was relatively low earlier. His blood pressure medications are on hold. We will continue to monitor that and we may need to resume one of his blood pressure medications later.  6. History of hyperlipidemia.  7. Tobacco abuse.   PLAN: I just called the respiratory therapist to intensify treatment with the bronchodilator therapy. Change oxygen supplementation from nasal cannula to face mask for better oxygen delivery. Since the patient started to have cough with sputum production, I will add antibiotic. Since he was treated with Levaquin recently and he finished a course, I will choose a different antibiotic. I will place him on Rocephin 1 gram daily. Continue IV Solu-Medrol. He just received a dose of Lasix, 20 mg was ordered by Dr. Lorre Nick. We will follow up on the basic metabolic profile  which unfortunately was drawn up before the Lasix. However, we will repeat the basic metabolic profile again in the morning. IV hydration should be continued. I will order chest x-ray for the morning just to make sure that there is no new development on the right side of the chest in terms of new infiltrate or any consolidation or effusion. The hospitalist team will continue to follow up with Dr. Lorre NickGittin on the condition of this pleasant gentleman.   TIME SPENT EVALUATING THIS PATIENT: More than 55 minutes. ____________________________ Carney CornersAmir M. Rudene Rearwish, MD amd:sb D: 06/17/2012  23:46:33 ET T: 06/18/2012 08:16:14 ET JOB#: [id number]  cc: Carney CornersAmir M. Rudene Rearwish, MD, <Dictator> Karolee OhsAMIR Dala DockM Shanard Treto MD ELECTRONICALLY SIGNED 06/19/2012 0:52

## 2014-09-11 NOTE — Consult Note (Signed)
PATIENT NAME:  Tony Farmer, Harm E MR#:  [id number] DATE OF BIRTH:  Jan 28, 1932  DATE OF CONSULTATION:  06/18/2012  CONSULTING PHYSICIAN:  Cristal Deerhristopher A. Olivia Pavelko, MD  REASON FOR CONSULTATION: Left pleural effusion in context of stage IV small cell cancer.   HISTORY OF PRESENT ILLNESS: The patient is a pleasant 81 year old recently diagnosed with small cell lung cancer which was metastatic to his lymph nodes and probable liver. He had a CT done on 06/03/2012 that showed what looks like an obstructed lung with atelectasis as well as a large effusion. He underwent chemotherapy and was admitted recently with chemotherapy. I was consulted for possible chest tube placement. Since his admission, he has been resuscitated and treated for hyperkalemia, but has become increasingly short of breath, requiring increased oxygen. I was consulted to determine whether or not there was benefit for chest tube placement in context of COPD exacerbation.   He otherwise supports no fevers, chills, night sweats, shortness of breath, cough, chest pain, abdominal pain, nausea, vomiting, diarrhea, constipation, dysuria or hematuria.   PAST MEDICAL HISTORY: As follows: 1. Small cell lung cancer with left lung collapse and metastases to the liver.  2. Hyperkalemia.  3. COPD.  4. Hyperlipidemia.  5. Hypertension.  6. History of central vein occlusion, left eye.   CURRENT MEDICATIONS: As follows: 1. Nicotine patch.  2. Solu-Medrol.  3. Temazepam.  4. Zofran.  5. Ceftriaxone.  6. Etoposide.  7. Albuterol.   ALLERGIES: No known drug allergies.   SOCIAL HISTORY: Smokes still a few cigarettes a day and uses an Teaching laboratory technician-cigarette, but long-term smoking history. Denies alcohol or other drug use. Family is here with him.   FAMILY HISTORY: No family history for cancer.   REVIEW OF SYSTEMS: A 12-point review of systems was obtained, and pertinent positives and negatives were recorded.   PHYSICAL EXAMINATION:  VITAL SIGNS:  Temperature 97.5, pulse 96, blood pressure 128/72, respirations 21, 93% on room air.  GENERAL: In no acute distress. Alert and oriented x3.  EYES: No scleral icterus. No conjunctivitis.  FACE: No obvious facial trauma. Normal external nose. Normal external ears.  NECK: Thin, but no obvious masses.  LUNGS: Wheezy at expiration. Decreased lung sounds in left chest.  HEART: Regular rate and rhythm. No murmurs, rubs or gallops.  ABDOMEN: Soft, nontender, nondistended.  EXTREMITIES: Moves all extremities well. Strength 5 out of 5 in all 4 extremities.  NEUROLOGIC: Cranial nerves II through XII grossly intact. Sensation intact in all 4 extremities.   LABORATORY: Significant for, from the 27th, white cell count of 11.4, platelets of 239. Most recent labs are significant for potassium now corrected at 4.8, creatinine 1.04.   IMAGING: CT scan imaging shows left lung opacity with a large known left pleural effusion. CT was evaluated by myself, which showed a significantly atelectatic lung with minimal aerated lung, concerning for obstruction, as well as a large left effusion.   ASSESSMENT AND PLAN: The patient is a pleasant 81 year old with increased wheezing and chronic obstructive pulmonary disease exacerbation and left effusion. I have talked to Dr. Lorre NickGittin and Dr. Belia HemanKasa, and we are all uncertain whether or not he would benefit from a chest tube. Despite having fluid removed, there is a good chance that the obstruction will prevent his lung from improving. However, if he worsens, will consider it. Dr. Lorre NickGittin is concerned about placing a chest tube and having the patient have potential source for infection through the chest tube site. I am happy to place a chest tube  if it is deemed necessary, but will defer at this time.    ____________________________ Si Raider Soraya Paquette, MD cal:OSi D: 06/18/2012 12:33:25 ET T: 06/18/2012 12:47:23 ET JOB#: [id number]  cc: Cristal Deer A. Azlin Zilberman, MD,  <Dictator> Jarvis Newcomer MD ELECTRONICALLY SIGNED 06/22/2012 13:21

## 2014-09-11 NOTE — H&P (Signed)
PATIENT NAME:  Tony Farmer, Tony Farmer MR#:  [id number] DATE OF BIRTH:  1931-07-18  DATE OF ADMISSION:  01/08/2013  HISTORY OF PRESENT ILLNESS: Tony Farmer was seen and evaluated in the clinic on August 20th. He was admitted. He was then seen again up in the hospital room that same evening. This narrative represents that evaluation, although was delayed until the current time. Tony Farmer is a 81 year old patient well known to me with small cell cancer. He progressed after prior therapy with VP-16 and carboplatin, and he is failing salvage weekly therapy with topotecan. He has had cytopenia due to topotecan, and recently, platelet counts are low. They are down to 54,000 on admission. There is no evidence that he is having regression of tumor. He is having persistent and somewhat increasing pleural effusion. On August 18, he was in the office with an amount of weakness and was given a transfusion, with some improvement in weakness and shortness of breath, but he remained severely debilitated, failure to thrive, short of breath on minimal exertion, even on oxygen at 4 liters, which gets his oxygen saturation up to 91%. He can barely walk, he has been just barely able to get to a commode at home, cannot do activities of daily living. He is not having chest pain or dizziness, but is extremely debilitated. Also, secondary to his physical debilitation, his mood is flat, and he has become more anxious. He has had some cough, but nonproductive. No hemoptysis. It is recognized that thoracentesis therapeutically to drain the lung that is nearly completely opacified on a recent x-ray could offer him palliation. He was so debilitated that he would have been, in my opinion, unsafe to go home for the evening and coming back and forth would have been maybe impossible. Also, his platelet count was just at the borderline where thoracentesis would have been possible. We could not get him a thoracentesis done on August 20th, and he would have  needed platelet transfusion on August 21st if the platelet counts had dropped, so he was hospitalized, with his usual oxygen support, his usual medications were given, PT and PTT were checked and they were unremarkable, and plans were for a therapeutic thoracentesis of the left lung, ultrasound guided, with or without additional platelet transfusion. As dictation is delayed, I can note that the next morning, August 21,  that a platelet count is 61,000, which according to my discussion with Dr. Register, is okay for the procedure without any transfusion support.   PAST MEDICAL HISTORY: COPD and a long-term smoker, hypertension, hyperlipidemia, prior central vein occlusion of the left eye.   FAMILY HISTORY: Negative for malignancy, noncontributory.   SOCIAL HISTORY: No alcohol, but tobacco above.   ALLERGIES: No known allergies.   MEDICATIONS: He has been on: 1. Magnesium oxide 400 mg 1 tablet twice a day.  2. Budesonide inhaler twice a day.  3. Nystatin p.o. p.r.n. after inhaler.  4. Albuterol inhaler 4 times a day p.r.n. 5. Lasix 20 mg p.o. daily.  6. Diltiazem extended release 240 mg once a day. 7. Prednisone 20 mg daily.   ADDITIONAL REVIEW OF SYSTEMS:  GENERAL: He had no headache and no dizziness. No definite orthostasis. Extreme weakness.  HEENT: No new visual disturbances. No current active thrush. No pharyngitis.  RESPIRATORY: He has a cough, nonproductive. No hemoptysis. Shortness of breath at rest even with oxygen. No retrosternal chest pain.  ABDOMEN: No abdominal pain. No vomiting. No diarrhea. Poor appetite. No constipation. BACK: Some back and  flank fleeting pain, nonpleuritic, that has been relieved by Tylenol.  GENITOURINARY: No dysuria or hematuria.  EXTREMITIES: He has had some waxing and waning lower extremity pretibial and foot edema that is at his baseline.  HEMATOLOGIC: Some bruising on the forearms. No rash. No bleeding. No epistaxis.  NEUROLOGIC: No paresthesias. No  focal weakness. He is generally weak.   PHYSICAL EXAMINATION:  GENERAL: Cachectic appearance.  HEENT: Sclerae clear. No thrush in the mouth. LYMPHATIC: No lymph nodes palpable in the neck, supraclavicular, submandibular or axillae. LUNGS: There are some transmitted sounds in the left lung in the extreme apex, but otherwise no air entry and dullness throughout the left lung. Decreased air entry in the right lung, but no wheezing or rales or rhonchi.  HEART: Regular with ectopic beats.  ABDOMEN: Nontender. No palpable mass or organomegaly.  EXTREMITIES: He has trace to 1+ pretibial and trace edema of the feet around the ankle.  NEUROLOGIC: Grossly nonfocal. PSYCHIATRIC: His affect is flat. He is slightly agitated.  LABORATORY RESULTS: The white count was 5.3, hemoglobin 8.8, platelets were 54,000, and note the next morning, on August 21, platelets were 61,000. PT and PTT were normal. Creatinine on August 18th was 1.51.   IMPRESSION AND PLAN: The patient with small cell cancer, not responding to salvage topotecan. Thrombocytopenia due to chemotherapy. Anemia, recently transfused but still symptomatic of anemia, but general weakness also due to debilitation from progressive cancer, hypoxia, strain of tachypnea and pleural effusion. He has, in the past, had thoracentesis with good temporary result in relieving breathlessness. He has been evaluated for, but had refused any thoracic surgery intervention or placement of Pleurx catheter. He has adequate platelet count for procedures now, and he has a normal PT, PTT. He is hospitalized for inpatient procedure which would not have been safe or logistically feasible as an outpatient. He might have, but he currently does not appear to need transfusion support. Depending on the response to thoracentesis, he may need additional transfusion of blood even though hemoglobin is at an 8.8 level and might be adequate for some other patients. We are not going to give any  more topotecan. If he can have a successful thoracentesis, we may repeat a noncontrast CT. I have been discussing with him about moving forward to comfort care only or trying additional salvage therapies.    ____________________________ Knute Neuobert G. Lorre NickGittin, MD rgg:OSi D: 01/09/2013 08:54:31 ET T: 01/09/2013 09:21:18 ET JOB#: [id number]  cc: Knute Neuobert G. Lorre NickGittin, MD, <Dictator> Marin RobertsOBERT G March Joos MD ELECTRONICALLY SIGNED 01/14/2013 14:39

## 2014-09-11 NOTE — Discharge Summary (Signed)
PATIENT NAME:  Tony Farmer, [name] MR#:  [id number] DATE OF BIRTH:  [birth year]  DATE OF ADMISSION:  01/10/2013 DATE OF DISCHARGE:   01/13/2013  FINAL DIAGNOSES: 1.  Underlying small cell lung cancer metastatic to the liver and progressive in the lung.  2.  Malignant effusion due to lung cancer.  3.  Underlying chronic obstructive pulmonary disease.  4.  Anemia.  5.  Hypoxia attributed to cancer, chronic obstructive pulmonary disease, the effusion and anemia.  6.  Air hunger due to hypoxia and effusion, acute on chronic respiratory failure.   History and physical dictated on admission, on January 08, 2013.   HOSPITAL COURSE:  The patient was initially admitted as noted, and he was hospitalized and started on fluid support and initially planned if he could have an inpatient thoracentesis after documenting normal PT and PTT. The patient was found not to have enough fluid for thoracentesis. He had increasingly symptomatic anemia, so he was given a transfusion of 1 unit of packed red blood cells and he was also referred for hospice evaluation assistance at home. He was also referred to, and his case was reviewed with, radiation/oncology to consider, but ultimately rejected if there was any role in palliative radiation to consolidated and tumor infiltrated portion of the lung. The patient was started on morphine for air hunger and continued supportive care. Case management and hospice were consulted. The patient was made a DO NOT RESUSCITATE and plans were made for him to go home with hospice assistance.   On August 25th, he was discharged home.   His medications at home were diltiazem 240 mg once a day, Lasix 20 mg daily, albuterol/ipratropium inhaler q.i.d. p.r.n., magnesium oxide 400 mg twice a day, nystatin oral 4 times a day, prednisone 20 mg daily, Tylenol 500 mg q.6 hours p.r.n., morphine 2.5 mg morphine elixir q.2 hours p.r.n. for shortness of breath, calcium carbonate 1 twice a day, cetirizine 10 mg  once a day and Ativan 0.5 mg every 4 hours as needed for anxiety. He also had home nebulizer. He has oxygen at 4 liters per nasal cannula.   On a regular diet, very limited activity as tolerated with assistance. He was not to undergo any other antineoplastic treatment. He embraced comfort care, hospice care, and NO CODE.   PHYSICAL EXAMINATION: GENERAL: He was alert, cooperative, generally weak with some pallor. He had some tachypnea on speaking, comfortable at rest.  LYMPH: No palpable lymph nodes in the neck. There was some air entry in the left upper lobe but there was no air entry in the mid or lower lobe zones. There was corresponding dullness.  HEART: Regular.  ABDOMEN: Nontender. No palpable mass or organomegaly.  EXTREMITIES: No significant extremity edema. No cyanosis.  NEUROLOGIC:  Grossly nonfocal. PSYCHIATRIC:  His  mood and affect was normal.   His most recent lab results had shown on August 22nd a platelet count of 65,000. CT chest had been done on August 22nd. His creatinine on August 21st was 1.0. The hemoglobin on August 21st was 8.9.   Physician followup p.r.n.   ____________________________ Knute Neuobert G. Lorre NickGittin, MD rgg:dmm D: 02/10/2013 09:35:00 ET T: 02/10/2013 10:13:23 ET JOB#: [id number]  cc: Knute Neuobert G. Lorre NickGittin, MD, <Dictator> Marin RobertsOBERT G GITTIN MD ELECTRONICALLY SIGNED 02/19/2013 11:22

## 2014-09-11 NOTE — Consult Note (Signed)
    Comments   Case discussed with Dr Lorre NickGittin. Agree with plan per oncology. Will hold off on consult at this time. Please reconsult if needed.   Electronic Signatures: Borders, Daryl EasternJoshua R (NP)  (Signed 30-Jan-14 16:51)  Authored: Palliative Care   Last Updated: 30-Jan-14 16:51 by Malachy MoanBorders, Joshua R (NP)

## 2014-09-11 NOTE — Discharge Summary (Signed)
PATIENT NAME:  Tony Farmer, Tony Farmer MR#:  [id number] DATE OF BIRTH:  [birth year]  DATE OF ADMISSION:  06/14/2012 DATE OF DISCHARGE:  06/28/2012  DISPOSITION: The patient is discharged. The patient is being transferred to a nursing home today.   FINAL DIAGNOSES:  1. Underlying small cell lung cancer.  2. Effusion and atelectasis, collapse, with a whiteout of the left lung.  3. Respiratory failure due to collapse of the left lung plus chronic obstructive pulmonary disease exacerbation.  4. End-stage chronic obstructive pulmonary disease.  5. Lower extremity edema. 6. Hyperkalemia attributed to some tumor lysis.  7. Thrombocytopenia, attributed to the effects of chemotherapy, improving.  8. Hypertension.  HISTORY AND PHYSICAL: Dictated on admission.   HOSPITAL COURSE: The patient was admitted and given hydration and then empiric antibiotics for possible postobstructive pneumonia, and then started on chemotherapy for small cell cancer with carboplatin and VP-16, day 1 treatment on Saturday, January 25th, and then day 2 VP-16 on Sunday, January 26th, and then no additional therapy. Although 3 days' treatment was planned, VP-16 was subsequently held on day 3. Then, Neulasta was given on day 5, January 29th. The patient had pulmonary consultation and medicine consultation and followup. He was treated with fluids, antibiotics, steroids, nebulizer therapies. Later, with no evidence of active infection, his antibiotics were stopped, and his fluid maintenance was stopped. He was noted to have hyperkalemia and was treated with p.r.n. doses of Kayexalate. With lower extremity edema, he was given low-dose Lasix. His initial blood pressure medications, consisting of diltiazem, lisinopril and hydrochlorothiazide, had been stopped in recent weeks, but during hospitalization, his blood pressure picked up. He was restarted on his diltiazem. On the last day of his hospitalization, he was restarted on antibiotic with Levaquin  empirically due to productive cough. During hospitalization, he was frail and initially immobile, but he made slow progress, so that COPD improved, air entry to the right lung was better, wheezing resolved. There was some improvement with slight air entry noted in the apex in the left lung on exam, but the x-ray still showed an opacification. His strength improved. He was mobilized a bit. He was able to ambulate some on the day prior to discharge, with him gaining strength every day. He developed slight symmetric lower extremity edema. He had some weeping and small bulla, fluid blisters, that remained clean, with no signs of infection. His white count never really dropped, and then is up to a range of 17,000, possibly reflecting pneumonia or recurring marrow. His hemoglobin has held. His platelet count dropped down to a nadir of 21,000 and then held there for 24 hours, and then began to improve and is up to 54,000 currently. His creatinine remained steady at 0.8, and liver chemistries also remained steady. He initially had CO2 retention that was significant, but he had made a decision not to have intubation or any aggressive measures or any code.   PHYSICAL EXAMINATION: On the day of discharge, he is alert and cooperative. Neuro exam is nonfocal. He has no thrush. He has no palpable nodes. There is slight air entry in the left apex, but otherwise no air entry in the left lung. The right lung air entry is better, and there is no wheezing or rhonchi in the right lung. His abdomen is slightly distended, but not tympanitic, nontender. No palpable mass or organomegaly. He has symmetric lower extremity 2+ to 3 edema with a few small fluid blisters on the left leg. He is on 5 liters of  oxygen.   DISCHARGE PLANS: To continue his steroid low dose and then taper, cutting down to 10 mg a day, continue oxygen at 5 liters, continue nebulizer treatments, continue physical therapy, continue to check potassium daily and treat  p.r.n. for high potassium with doses of Kayexalate. To monitor his CBC. To monitor signs of infection. To re-evaluate him next week for the possibility of additional chemotherapy depending on evidence of response and the patient's personal wishes.   CODE STATUS: He is no code, no intubation, at his request, understanding the implications of that decision.    ____________________________ Knute Neu Lorre Nick, MD rgg:OSi D: 06/28/2012 17:17:00 ET T: 06/29/2012 09:10:42 ET JOB#: [id number]  cc: Knute Neu. Lorre Nick, MD, <Dictator> Marin Roberts MD ELECTRONICALLY SIGNED 07/12/2012 14:19

## 2014-09-11 NOTE — H&P (Signed)
PATIENT NAME:  Tony Farmer, Tony Farmer MR#:  [id number] DATE OF BIRTH:  [birth year]  DATE OF ADMISSION:  06/14/2012  HISTORY OF PRESENT ILLNESS: The patient is a 81 year old patient who was seen and evaluated by me for initial consultation and although he presented in the clinic, he was subacutely and acutely ill and was hospitalized now and admitted. The patient has increased weakness related to underlying COPD and progressive cancer. He is less mobile, has less exercise tolerance, although he has been able to ambulate slowly with assistance. He has been short of breath on minimal exertion, although he had been comfortable at rest on oxygen 2.5 liters started recently. He has had some cough that is nonproductive. He had not noticed any wheezing recently. He has been complaining of some left-sided chest pain in left lateral chest, but not pleuritic in nature. He has been using a ProAir inhaler p.r.n. He does not have any retrosternal chest pain or any focal bone pain. He has recently been found to have an abnormal chest x-ray and a significantly abnormal CAT scan with whiteout of the left lung due to collapse and obstruction. He underwent a bronchoscopy that showed positive for small cell carcinoma of the lung. CT scan of the chest also reveals metastatic disease in the liver. He has not had brain or bone imaging. He is referred for oncology opinion and for the urgency of his condition and the desire to start his therapy as soon as possible, plus to treat an exacerbation of COPD with wheezing in the right lung noted today in the clinic, the patient is hospitalized.   PAST MEDICAL HISTORY: Notable for COPD and long-term cigarette smoking. The patient still smokes a few cigarettes a day and has started using electronic cigarette. He has history of hyperlipidemia and hypertension, although recently his blood pressure has been running low and he has been discontinued from prior medications for blood pressure (doxepin,  lisinopril and hydrochlorothiazide). 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 He has no other surgeries or hospitalizations. He has never had a colonoscopy.   MEDICATIONS: Recently on diltiazem 240 mg daily; this was discontinued. Recently on lisinopril 20 and hydrochlorothiazide 25 a day, which was discontinued. He just completed a course of oral Levaquin. He has recently completed prednisone with a taper. He uses a Ventolin inhaler 2 puffs 4 times a day p.r.n.   ALLERGIES: No known allergies.   FAMILY HISTORY: Negative for malignancy.  SOCIAL HISTORY: Tobacco as above. No alcohol.   REVIEW OF SYSTEMS: He has significant weakness and decline in exercise tolerance. His appetite for solid foods is down, but he is still taking in adequate fluids. He does not have any headache or dizziness or difficulty concentrating or any focal weakness. No ear or jaw pain. No palpitations or retrosternal chest pain. No reflux. No vomiting or diarrhea. No nausea. No abdominal pain. No dysuria or hematuria. No rash or bruising. No polyuria or polydipsia.   LABORATORY, DIAGNOSTIC, AND RADIOLOGICAL DATA: No prior labs were submitted. Today, white count is 7.0, hemoglobin 11.7, platelets 245. He has a lymphopenia of  600; neutrophils were 5.8. His chemistry metabolic panel is still pending. No prior history of hepatic or renal disease. On January 17, fine needle aspirate done endoscopically of mediastinal lymph node, subcarinal node, is positive for small cell carcinoma. His CT scan chest with contrast done January 13 revealed a large left mid lung mass and surrounding atelectasis and some pleural fluid, large mediastinal lymphadenopathy,  also lesions in the liver, a 3.8 cm in the right hepatic lobe and a lesion in the left lobe measuring 5.3 x 3.2 and an adjacent other smaller liver nodule.   PHYSICAL EXAMINATION:  GENERAL: The patient has minimal but no significant pallor. No scleral jaundice. He  is alert and cooperative. He looks subacutely and chronically ill in a wheelchair on oxygen.  HEENT: No thrush in the mouth. NECK: No mass in the neck.  LYMPH: There is no lymphadenopathy in the neck, supraclavicular, submandibular or axilla. LUNGS: There is no air entry in the left lung, and there is mid and end-expiratory wheezing in the right lung.  HEART: Regular.  ABDOMEN: Nontender. No palpable mass or organomegaly.  SKIN: There is no significant bruising and no rash.  NEUROLOGIC: Grossly nonfocal but some general weakness. The lower extremities are a bit weaker.  EXTREMITIES: Trace ankle edema bilaterally.  PSYCHIATRIC: Mood and affect is appropriate. He is alert, cooperative, oriented.   IMPRESSION: The patient has small cell lung cancer with extensive disease with lung and liver disease. He does not have symptoms of bony disease or symptoms of central nervous system disease, but he has not had extensive bone or brain imaging. He has collapsed the left lung. He has chronic obstructive pulmonary disease, and currently he has wheezing in the right lung, although his oxygenation is holding on 2.5 liters. He has some nonproductive cough. He has a borderline anemia compatible with malignancy and he has minimal lymphopenia compatible with recent steroids. His chemistries have not yet been documented; those results are pending. He had recently been recorded to have no history of liver or kidney abnormalities.   PLAN: The patient is admitted to the hospital for treatment of COPD and also to go ahead and plan, as early as tomorrow so it can be given without delay, systemic treatment for small cell cancer. The planned therapy is VP-16 80 mg/sq m per day x 3 days and carboplatin area under the curve of 5 on day 1 only, to be followed by Neulasta on day 4. Later, will get surgical consultation for placement of a Port-A-Cath for subsequent cycles. Later, obtain brain imaging, might be with CT rather than  MRI as the patient states he has severe claustrophobia. Either way, currently unable to lie flat or participate in other scan. Later, consider if there is any role in bone scanning, but there are no bone symptoms or clearly defined sclerotic lesions in the skeleton on the chest CT scan, and it is already established that he has advanced disease. Currently no role in radiation, as chemotherapy is expected to be very effective and radiation now might delay or compromise or add severe toxicity, so radiation in someone with stage-advanced disease, no role of radiation immediately. Would watch the patient's blood pressure with history of hypertension, but currently off of antihypertensives. Would add steroids for COPD. SVN p.r.n. and around the clock. No automatic role for antibiotics now. No need for repeat chest x-ray at this time. Nicotine patch. No role now, but watch for possible need for analgesics. TEDs and mobilization as able out of bed. Consider prophylaxis for clot.   ____________________________ Knute Neuobert G. Lorre NickGittin, MD rgg:jm D: 06/14/2012 18:27:55 ET T: 06/14/2012 19:39:17 ET JOB#: [id number]  cc: Knute Neuobert G. Lorre NickGittin, MD, <Dictator> Marin RobertsOBERT G GITTIN MD ELECTRONICALLY SIGNED 07/12/2012 14:21

## 2014-09-11 NOTE — H&P (Signed)
PATIENT NAME:  Tony, Farmer MR#:  [id number] DATE OF BIRTH:  [birth year]  DATE OF ADMISSION:  11/25/2012  HISTORY OF PRESENT ILLNESS: Tony Farmer is a 81 year old patient who was admitted today for increasing shortness of breath, increasing weakness, hypoxia due to an increasing pleural effusion. He has underlying small cell cancer and has had recently progressive disease. He is status post prior therapy with VP-16 and carboplatin and status post 1 treatment 1 week ago of topotecan, week 1 of salvage regimen. He has underlying COPD. He has been oxygen dependent, oxygen on 3 to 4 liters at home. He has subjectively had more increased shortness of breath at home on minimal exertion, so that he is very limited in his mobility. With his oxygen at complete rest, he has been stable. He has not had chest pain, headache, dizziness, chills or sweats, but had increased cough, increased weakness and increased shortness of breath as noted. He has been aware of rapid heart beat, but no irregular heartbeat. No abdominal pain. No nausea or vomiting. No diarrhea. No focal weakness or numbness in any of the extremities. No rash, bruising or pruritus except for bruising on the forearms. No increased extremity edema. No change in bowel habits. No dysuria or hematuria.   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   FAMILY HISTORY: Noncontributory.   SOCIAL HISTORY: A smoker. No alcohol.   ALLERGIES: No known allergies.   MEDICATIONS AT HOME: 1. Magnesium oxide 400 mg 1 twice a day.  2. Albuterol nebulizers q.4 hours p.r.n. 3. Budesonide inhaled suspension p.r.n. 4. Lasix 20 mg p.o. every other day.  5. Diltiazem extended release 240 mg daily.  6. Prednisone 10 mg daily. 7. ProAir inhaler 4 times a day p.r.n.  8. Has taken Tylenol p.r.n. for muscular ache.  9. Additional  support medications have been frequent intravenous magnesium replacements on a p.r.n. basis.   PHYSICAL EXAMINATION:  GENERAL: The patient is alert and cooperative and in no acute distress at rest, but on oxygen 2 to 3 liters. Even at rest, he is short of breath. He is comfortable at rest on 4 liters. He had been, in the office, out of breath on minimal movements.  HEENT: Sclerae: No jaundice. Mouth: No thrush.  LUNGS: Generally decreased air entry, but more decreased in the left base and mid zone, absent sounds in the lower one-third of the lung with corresponding dullness. He is not having rhonchi currently, no wheezing, when examined in the office or later when on the hospital floor.  LYMPH NODES: Negative in the neck, supraclavicular, submandibular.  HEART: Irregular with occasional irregular beats, which has been noted to be an old finding.  ABDOMEN: Nontender. No palpable mass or organomegaly.  EXTREMITIES: No extremity edema. Generally cachectic appearance.  NEUROLOGIC: No gross focal weakness. Alert and cooperative.   LABORATORY DATA: Chest x-ray done earlier in the day from the clinic shows increasing left-sided mass and increasing effusion. Pro time was 1.0, PTT was 28.1. His magnesium was 1.3. His potassium 4.3, creatinine 1.1, glucose 151, calcium 9.6. Recent liver functions were unremarkable. His white count is 6.0, hemoglobin 11, hematocrit 33, platelets 105.   IMPRESSION: The patient with underlying lung cancer and has chronic obstructive pulmonary disease and hypoxia. Chronic obstructive pulmonary disease has been compensated recently, put increasing shortness of breath attributed to increased effusion as demonstrated on x-ray. This  is attributed to increasing mass and tumor burden. The patient has a mild anemia which he has tolerated. Has history of benign atrial arrhythmias. He has no signs of active infection. He has also had difficult social situation. His wife is ill at home. He  has also had problem with home and personal insect and cockroach infestation, and he had some roaches crawling on his body and his clothing during his initial examination in the office. Later, he was examined again up in the hospital. He has been cleaned, is comfortable.  CBC is unremarkable; mild thrombocytopenia can be attributed to prior and recent chemotherapy treatment, topotecan 1 week ago, and he received a dose of 3 mg/sq M topotecan, planned at 3 or 4 mg/sq M on a weekly basis. Week 2 today was held. Low magnesium was demonstrated. The magnesium replacement was started in the hospital. Other chronic medications and inhalers are continued.   PLAN: As above, continue regular medications. Replace magnesium and follow magnesium levels later. Watch the CBC with recent chemotherapy. Have checked baseline coag studies. Planning and have scheduled an ultrasound-guided thoracentesis. Later, would plan to continue treatment. Would watch the effect of treatment on effusion or the duration of palliation that is achieved. Later considerations include repeat thoracentesis or Pleurx catheter placement with thoracic surgery evaluation.   ____________________________ Tony Neuobert G. Lorre NickGittin, MD rgg:OSi D: 11/26/2012 00:05:39 ET T: 11/26/2012 05:52:22 ET JOB#: [id number]  cc: Tony Neuobert G. Lorre NickGittin, MD, <Dictator> Tony RobertsOBERT G GITTIN MD ELECTRONICALLY SIGNED 12/04/2012 18:30

## 2014-12-01 IMAGING — CR DG CHEST 2V
1 series · 2 of 2 positions shown · non-contrast
Comparison: none

REASON FOR EXAM: lung ca pleural effusion please compare [REDACTED] cxr and
may CT scan call [id number]
COMMENTS:  or 0484

[Series 1: w chest pa · 0.14mm/px · 2 of 2 slices shown]
[im 1/2]
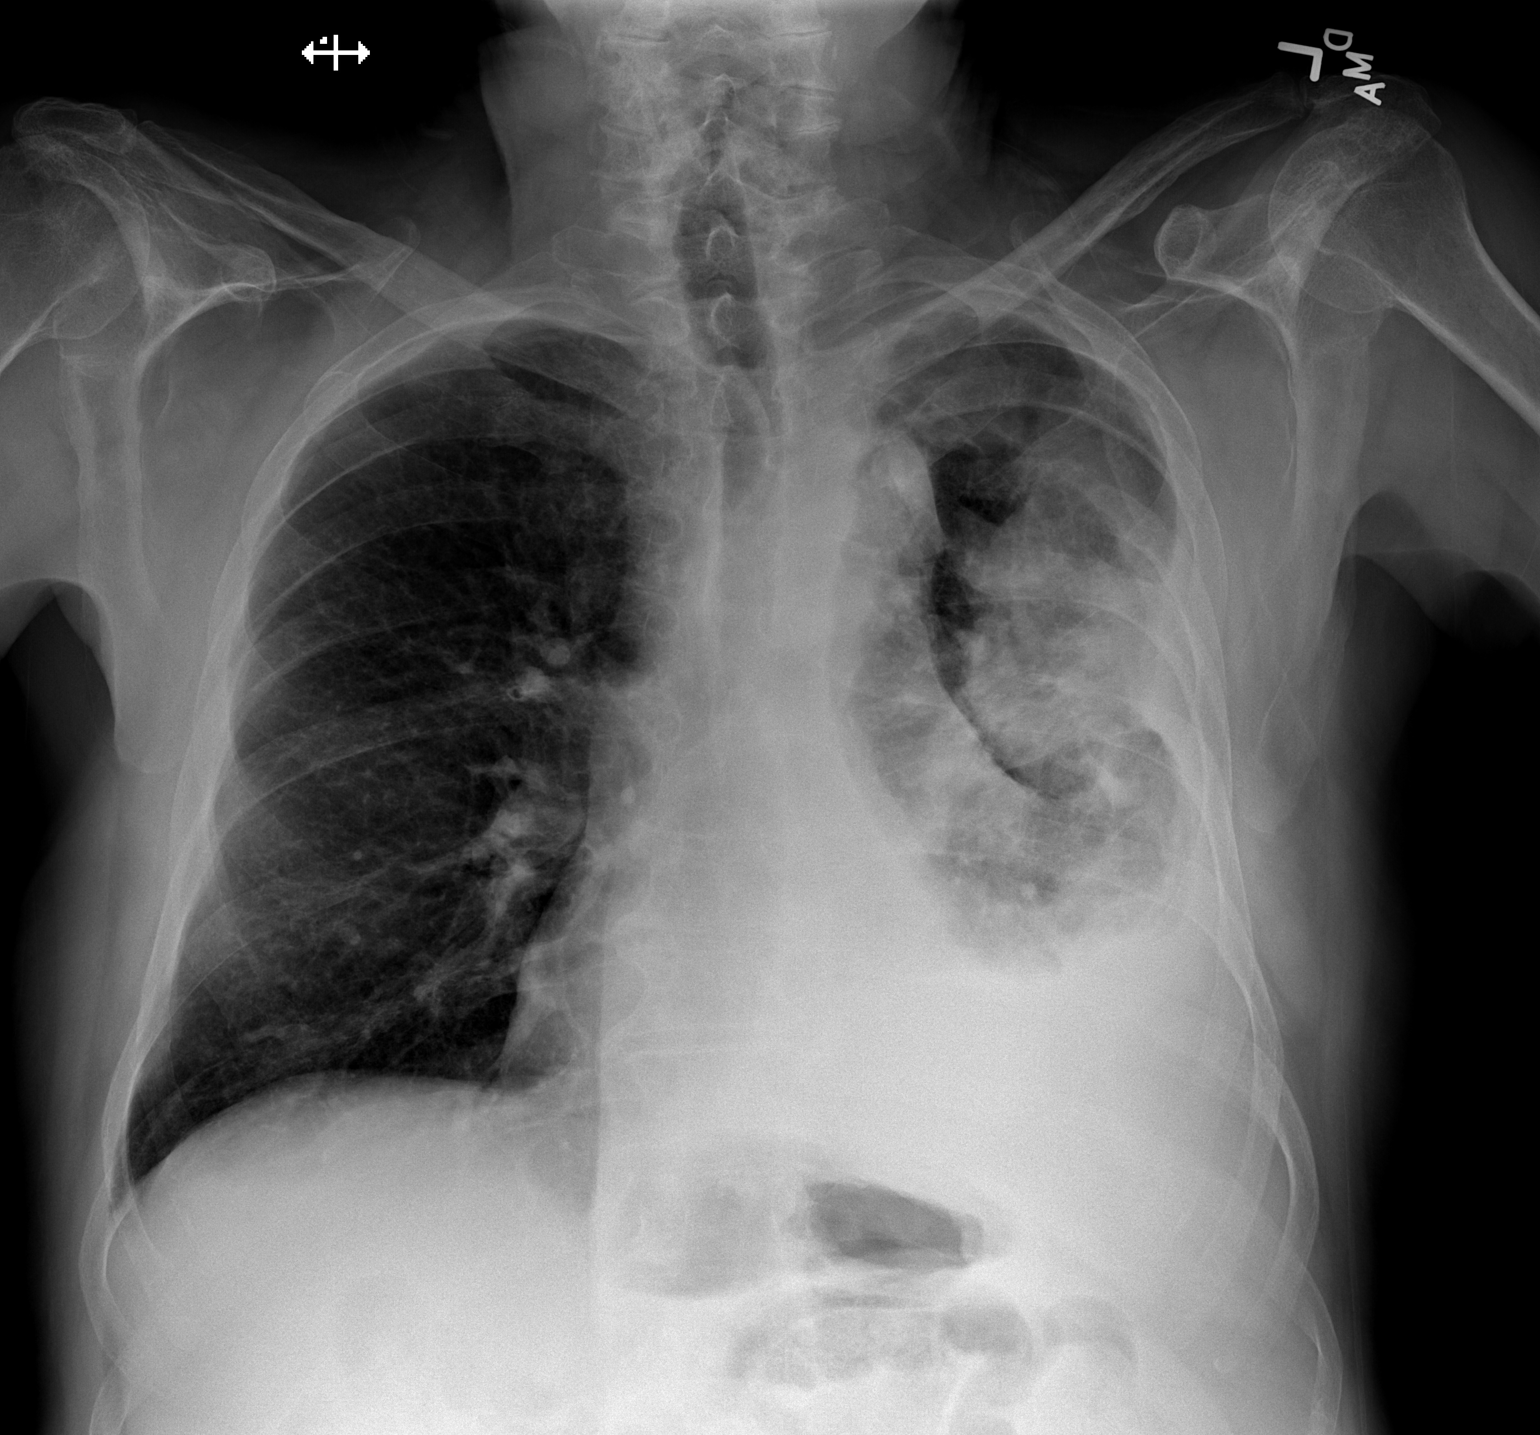
[im 2/2]
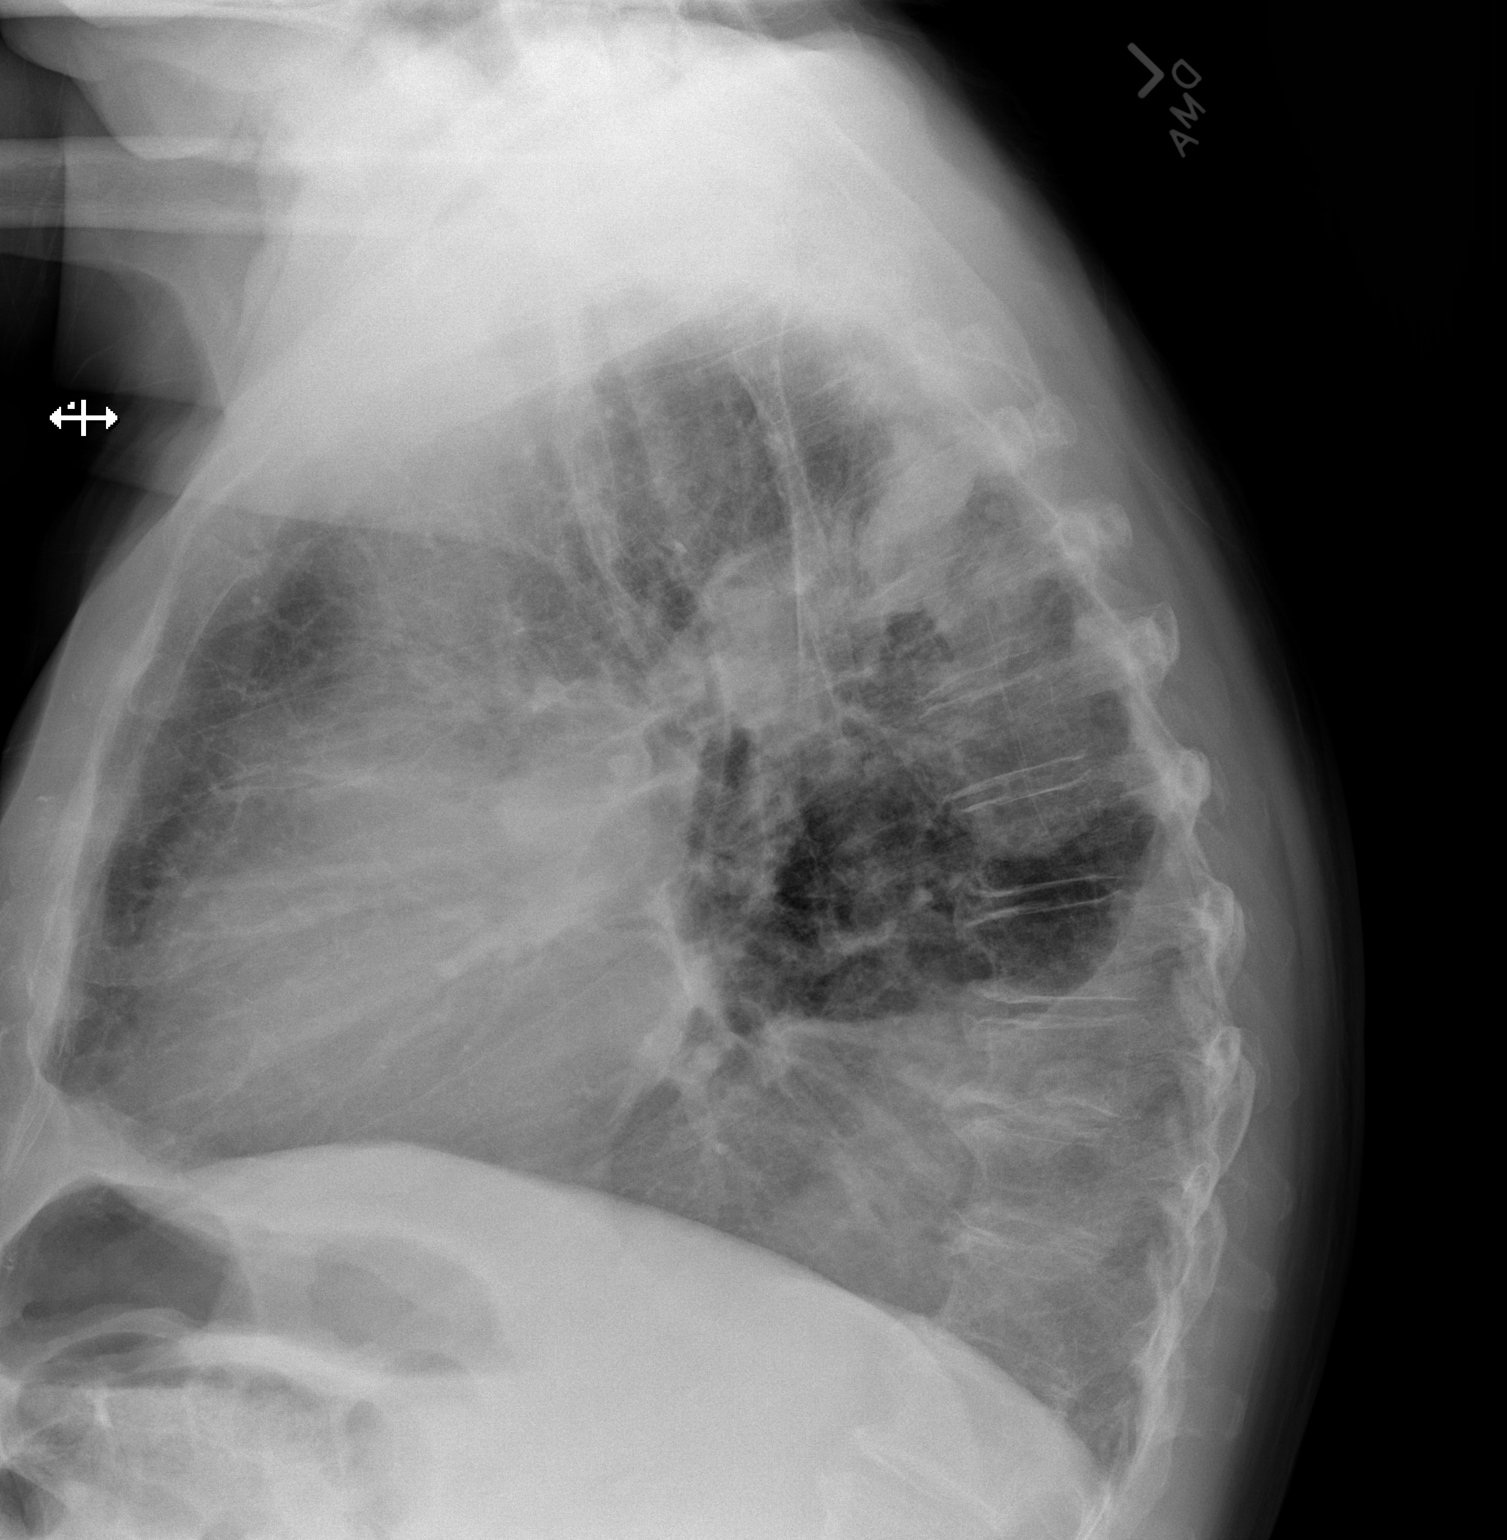

[2 of 2 positions shown; findings below may reference images not displayed]

PROCEDURE:     DXR - DXR CHEST PA (OR AP) AND LATERAL  - November 11, 2012 [DATE]

RESULT:     Comparison is made to the previous study dated 07/29/2012 and to
an image from 07/10/2012. There is progressive density in the left hemithorax
compared to the most recent study with elevation of the left hemidiaphragm
and some underlying left pleural effusion. The right lung is hyperinflated
but clear. The heart size appears to be mildly enlarged and unchanged.
IMPRESSION: Progressive density in the left hemithorax which could
represent underlying progression of mass. Left pleural effusion is
moderately large.

[REDACTED]
